# Patient Record
Sex: Female | Born: 1995 | Race: Black or African American | Hispanic: No | Marital: Single | Smoking: Never smoker
Health system: Southern US, Community
[De-identification: ages and names within clinical notes are randomized; demographics above are authoritative.]

## PROBLEM LIST (undated history)

## (undated) DIAGNOSIS — D649 Anemia, unspecified: Secondary | ICD-10-CM

## (undated) DIAGNOSIS — G43909 Migraine, unspecified, not intractable, without status migrainosus: Secondary | ICD-10-CM

## (undated) HISTORY — PX: NO PAST SURGERIES: SHX2092

---
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
## 2015-11-19 ENCOUNTER — Encounter (HOSPITAL_COMMUNITY): Payer: Self-pay | Admitting: Emergency Medicine

## 2015-11-19 ENCOUNTER — Emergency Department (HOSPITAL_COMMUNITY)
Admission: EM | Admit: 2015-11-19 | Discharge: 2015-11-20 | Disposition: A | Discharge status: Home / Self Care | Payer: 59 | Attending: Emergency Medicine | Admitting: Emergency Medicine

## 2015-11-19 ENCOUNTER — Emergency Department (HOSPITAL_COMMUNITY): Payer: 59

## 2015-11-19 DIAGNOSIS — Z862 Personal history of diseases of the blood and blood-forming organs and certain disorders involving the immune mechanism: Secondary | ICD-10-CM | POA: Diagnosis not present

## 2015-11-19 DIAGNOSIS — R0789 Other chest pain: Secondary | ICD-10-CM

## 2015-11-19 DIAGNOSIS — R0602 Shortness of breath: Secondary | ICD-10-CM | POA: Insufficient documentation

## 2015-11-19 DIAGNOSIS — Z8679 Personal history of other diseases of the circulatory system: Secondary | ICD-10-CM | POA: Insufficient documentation

## 2015-11-19 DIAGNOSIS — R079 Chest pain, unspecified: Secondary | ICD-10-CM | POA: Diagnosis present

## 2015-11-19 HISTORY — DX: Anemia, unspecified: D64.9

## 2015-11-19 HISTORY — DX: Migraine, unspecified, not intractable, without status migrainosus: G43.909

## 2015-11-19 LAB — BASIC METABOLIC PANEL
Anion gap: 12 (ref 5–15)
BUN: 15 mg/dL (ref 6–20)
CALCIUM: 9.5 mg/dL (ref 8.9–10.3)
CO2: 20 mmol/L — ABNORMAL LOW (ref 22–32)
CREATININE: 0.79 mg/dL (ref 0.44–1.00)
Chloride: 105 mmol/L (ref 101–111)
GFR calc non Af Amer: >60 mL/min (ref >60)
Glucose, Bld: 93 mg/dL (ref 65–99)
Potassium: 4 mmol/L (ref 3.5–5.1)
SODIUM: 137 mmol/L (ref 135–145)

## 2015-11-19 LAB — CBC
HCT: 35.5 % — ABNORMAL LOW (ref 36.0–46.0)
Hemoglobin: 11.3 g/dL — ABNORMAL LOW (ref 12.0–15.0)
MCH: 25.2 pg — AB (ref 26.0–34.0)
MCHC: 31.8 g/dL (ref 30.0–36.0)
MCV: 79.1 fL (ref 78.0–100.0)
PLATELETS: 406 10*3/uL — AB (ref 150–400)
RBC: 4.49 MIL/uL (ref 3.87–5.11)
RDW: 14.2 % (ref 11.5–15.5)
WBC: 8.1 10*3/uL (ref 4.0–10.5)

## 2015-11-19 LAB — I-STAT TROPONIN, ED: TROPONIN I, POC: 0 ng/mL (ref 0.00–0.08)

## 2015-11-19 NOTE — ED Notes (Signed)
Pt states that she has had chest pain since Friday that has progressively gotten worse. Sharp, L sided. Alert and oriented.

## 2015-11-20 MED ORDER — IBUPROFEN 600 MG PO TABS: 600.0000 mg | ORAL_TABLET | Freq: Four times a day (QID) | ORAL | Status: DC | PRN

## 2015-11-20 NOTE — ED Notes (Signed)
Bed: WA24 Expected date:  Expected time:  Means of arrival:  Comments: No monitor 

## 2015-11-20 NOTE — Discharge Instructions (Signed)

## 2015-11-20 NOTE — ED Provider Notes (Signed)
CSN: [id number]     Arrival date & time 11/19/15  2006 History   First MD Initiated Contact with Patient 11/20/15 [phone]     Chief Complaint  Patient presents with  . Chest Pain     (Consider location/radiation/quality/duration/timing/severity/associated sxs/prior Treatment) Patient is a 20 y.o. female presenting with chest pain. The history is provided by the patient.  Chest Pain Pain location:  L chest Pain quality: sharp and stabbing   Pain radiates to:  Does not radiate Pain radiates to the back: no   Pain severity:  Moderate Onset quality:  Gradual Duration:  5 days Timing:  Sporadic Progression:  Waxing and waning Context: breathing (deep breath)   Relieved by:  Nothing Worsened by:  Nothing tried Ineffective treatments:  None tried Associated symptoms: shortness of breath (with walking)   Associated symptoms: no abdominal pain, no cough, no fever, no lower extremity edema, no nausea and not vomiting   Risk factors: obesity   Risk factors: no diabetes mellitus, no high cholesterol, no hypertension and no smoking     Past Medical History  Diagnosis Date  . Migraines   . Anemia    No past surgical history on file. Family History  Problem Relation Age of Onset  . Heart attack Father    Social History  Substance Use Topics  . Smoking status: Never Smoker   . Smokeless tobacco: None  . Alcohol Use: No   OB History    No data available     Review of Systems  Constitutional: Negative for fever.  Respiratory: Positive for shortness of breath (with walking). Negative for cough.   Cardiovascular: Positive for chest pain.  Gastrointestinal: Negative for nausea, vomiting and abdominal pain.  All other systems reviewed and are negative.     Allergies  Review of patient's allergies indicates no known allergies.  Home Medications   Prior to Admission medications   Not on File   BP 118/85 mmHg  Pulse 94  Temp(Src) 98.3 F (36.8 C) (Oral)  Resp 13  Ht 5'  2" (1.575 m)  Wt 288 lb (130.636 kg)  BMI 52.66 kg/m2  SpO2 98%  LMP 11/19/2015 Physical Exam  Constitutional: She is oriented to person, place, and time. She appears well-developed and well-nourished. No distress.  Morbidly obese  HENT:  Head: Normocephalic.  Eyes: Conjunctivae are normal.  Neck: Neck supple. No tracheal deviation present.  Cardiovascular: Normal rate, regular rhythm and normal heart sounds.   Pulmonary/Chest: Effort normal and breath sounds normal. No respiratory distress. She exhibits tenderness (over left sternal border).  Abdominal: Soft. She exhibits no distension. There is no tenderness.  Neurological: She is alert and oriented to person, place, and time.  Skin: Skin is warm and dry.  Psychiatric: She has a normal mood and affect.  Vitals reviewed.   ED Course  Procedures (including critical care time) Labs Review Labs Reviewed  BASIC METABOLIC PANEL - Abnormal; Notable for the following:    CO2 20 (*)    All other components within normal limits  CBC - Abnormal; Notable for the following:    Hemoglobin 11.3 (*)    HCT 35.5 (*)    MCH 25.2 (*)    Platelets 406 (*)    All other components within normal limits  I-STAT TROPOININ, ED    Imaging Review Dg Chest 2 View  11/19/2015  CLINICAL DATA:  Mid chest pain and shortness of breath. EXAM: CHEST  2 VIEW COMPARISON:  None. FINDINGS: The heart  size and mediastinal contours are within normal limits. Both lungs are clear. The visualized skeletal structures are unremarkable. IMPRESSION: No active cardiopulmonary disease. Electronically Signed   By: Kennith Center M.D.   On: 11/19/2015 20:45   I have personally reviewed and evaluated these images and lab results as part of my medical decision-making.   EKG Interpretation   Date/Time:  Tuesday November 19 2015 20:12:49 EST Ventricular Rate:  104 PR Interval:  147 QRS Duration: 70 QT Interval:  333 QTC Calculation: 438 R Axis:   73 Text Interpretation:   Sinus tachycardia Low voltage, precordial leads  Baseline wander in lead(s) V1 ED PHYSICIAN INTERPRETATION AVAILABLE IN  [facility] Confirmed by TEST, Record ([id number]) on 11/20/2015 6:41:15 AM      MDM   Final diagnoses:  Chest wall pain    20 year old female presents with highly atypical chest pain, sharp over the precordium toward the left side, has been occurring intermittently over the last 5 days. Well score is 0 for PE, doubt thromboembolic event. Pain is reproducible on palpation, certain movements, and is highly likely to be musculoskeletal given the patient's morbidly obese habitus. Troponin is negative, heart score is 1, highly atypical symptoms for ACS. Plan for scheduled NSAIDs. Plan to follow up with PCP as needed and return precautions discussed for worsening or new concerning symptoms.    Lyndal Pulley, MD 11/20/15 [phone]

## 2016-11-03 ENCOUNTER — Other Ambulatory Visit: Payer: Self-pay | Admitting: Obstetrics and Gynecology

## 2016-11-03 ENCOUNTER — Other Ambulatory Visit (HOSPITAL_COMMUNITY)
Admission: RE | Admit: 2016-11-03 | Discharge: 2016-11-03 | Disposition: A | Discharge status: Home / Self Care | Payer: 59 | Source: Ambulatory Visit | Attending: Obstetrics and Gynecology | Admitting: Obstetrics and Gynecology

## 2016-11-03 DIAGNOSIS — Z01419 Encounter for gynecological examination (general) (routine) without abnormal findings: Secondary | ICD-10-CM | POA: Diagnosis present

## 2016-11-03 DIAGNOSIS — Z113 Encounter for screening for infections with a predominantly sexual mode of transmission: Secondary | ICD-10-CM | POA: Insufficient documentation

## 2016-11-04 LAB — CYTOLOGY - PAP
CHLAMYDIA, DNA PROBE: NEGATIVE
DIAGNOSIS: NEGATIVE
NEISSERIA GONORRHEA: NEGATIVE

## 2017-03-25 ENCOUNTER — Ambulatory Visit (INDEPENDENT_AMBULATORY_CARE_PROVIDER_SITE_OTHER): Payer: 59 | Admitting: Orthopedic Surgery

## 2017-03-25 ENCOUNTER — Ambulatory Visit (INDEPENDENT_AMBULATORY_CARE_PROVIDER_SITE_OTHER): Payer: 59

## 2017-03-25 DIAGNOSIS — M546 Pain in thoracic spine: Secondary | ICD-10-CM

## 2017-03-27 ENCOUNTER — Encounter (INDEPENDENT_AMBULATORY_CARE_PROVIDER_SITE_OTHER): Payer: Self-pay | Admitting: Orthopedic Surgery

## 2017-03-27 NOTE — Progress Notes (Signed)
Office Visit Note   Patient: Emily Goodman           Date of Birth: 07/22/1996           MRN: [id number] Visit Date: 03/25/2017 Requested by: Wilfrid LundBecker, Anna G, PA [address] [place] [id number] PCP: Tally JoeSwayne, David, MD  Subjective: Chief Complaint  Patient presents with  . Middle Back - Pain    HPI: Today is a patient with upper thoracic pain.  She also reports a lot of trapezial tension type pain.  Occasionally she'll have some low back pain as well but with no leg symptoms.  Her symptoms have been going on for several years.  She does take a muscle relaxer for this upper thoracic and trapezial pain.  It occurs on a daily basis.  Does not wake her from sleep.  She used to work at [employer] but she had to quit because of her back.  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  Denies any radicular symptoms in the arms.              ROS: All systems reviewed are negative as they relate to the chief complaint within the history of present illness.  Patient denies  fevers or chills.   Assessment & Plan: Visit Diagnoses:  1. 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  Patient has increased body mass index as well as a large mass of breast tissue.  Patient states that she is age E cup.  She has tried weight loss without success.  She is considering breast reduction surgery.  I would like her to try 6 week course of physical therapy first and then we will see her back at that time.  She is having some back issues which I think could be related to the uneven distribution of tissue between the front and back of her axial spine.  Follow-Up Instructions: Return in about 6 weeks (around 05/06/2017).   Orders:  Orders Placed This Encounter  Procedures  . XR Thoracic Spine 2 View   No orders of the defined types were placed in this encounter.      Procedures: No procedures performed   Clinical Data: No additional findings.  Objective: Vital Signs: There were no vitals taken for this visit.  Physical Exam:   Constitutional: Patient appears well-developed HEENT:  Head: Normocephalic Eyes:EOM are normal Neck: Normal range of motion Cardiovascular: Normal rate Pulmonary/chest: Effort normal Neurologic: Patient is alert Skin: Skin is warm Psychiatric: Patient has normal mood and affect    Ortho Exam: Orthopedic exam demonstrates full active and passive range of motion of the neck.  Shoulder range of motion is full.  Patient has large volume of soft tissue anterior to the thoracic spine.  She has no nerve retention signs in the lower chemise.  Does have some pain with forward lateral bending.  There is fair amount of muscle tension in the trapezial region.  Reflexes symmetric.  No paresthesias C5 T1.  No other masses lymph adenopathy or skin changes noted in the thoracic spine region  Specialty Comments:  No specialty comments available.  Imaging: No results found.   PMFS History: There are no active problems to display for this patient.  Past Medical History:  Diagnosis Date  . Anemia   . Migraines     Family History  Problem Relation Age of Onset  . Heart attack Father     No past surgical history on file. Social History   Occupational History  . Not on file.   Social History Main Topics  . Smoking status: Never Smoker  . Smokeless tobacco: Not on file  . Alcohol use No  . Drug use: Yes    Types: Marijuana  . Sexual activity: Not on file

## 2018-09-30 ENCOUNTER — Other Ambulatory Visit: Payer: Self-pay | Admitting: Obstetrics and Gynecology

## 2018-09-30 ENCOUNTER — Other Ambulatory Visit (HOSPITAL_COMMUNITY)
Admission: RE | Admit: 2018-09-30 | Discharge: 2018-09-30 | Disposition: A | Discharge status: Home / Self Care | Payer: BLUE CROSS/BLUE SHIELD | Source: Ambulatory Visit | Attending: Obstetrics and Gynecology | Admitting: Obstetrics and Gynecology

## 2018-09-30 DIAGNOSIS — R87612 Low grade squamous intraepithelial lesion on cytologic smear of cervix (LGSIL): Secondary | ICD-10-CM | POA: Diagnosis not present

## 2018-09-30 DIAGNOSIS — Z01419 Encounter for gynecological examination (general) (routine) without abnormal findings: Secondary | ICD-10-CM | POA: Insufficient documentation

## 2018-09-30 DIAGNOSIS — Z113 Encounter for screening for infections with a predominantly sexual mode of transmission: Secondary | ICD-10-CM | POA: Diagnosis not present

## 2018-10-06 LAB — CYTOLOGY - PAP
CHLAMYDIA, DNA PROBE: NEGATIVE
HPV: DETECTED — AB
NEISSERIA GONORRHEA: NEGATIVE

## 2018-11-09 DIAGNOSIS — Z3201 Encounter for pregnancy test, result positive: Secondary | ICD-10-CM | POA: Diagnosis not present

## 2018-11-09 DIAGNOSIS — Z3401 Encounter for supervision of normal first pregnancy, first trimester: Secondary | ICD-10-CM | POA: Diagnosis not present

## 2018-11-09 DIAGNOSIS — Z3490 Encounter for supervision of normal pregnancy, unspecified, unspecified trimester: Secondary | ICD-10-CM | POA: Diagnosis not present

## 2018-11-09 LAB — OB RESULTS CONSOLE ANTIBODY SCREEN: Antibody Screen: NEGATIVE

## 2018-11-09 LAB — OB RESULTS CONSOLE HIV ANTIBODY (ROUTINE TESTING): HIV: NONREACTIVE

## 2018-11-09 LAB — OB RESULTS CONSOLE RPR: RPR: NONREACTIVE

## 2018-11-09 LAB — OB RESULTS CONSOLE ABO/RH: RH Type: POSITIVE

## 2018-11-09 LAB — OB RESULTS CONSOLE HEPATITIS B SURFACE ANTIGEN: Hepatitis B Surface Ag: NEGATIVE

## 2018-11-09 LAB — OB RESULTS CONSOLE RUBELLA ANTIBODY, IGM: Rubella: IMMUNE

## 2018-11-23 DIAGNOSIS — Z3401 Encounter for supervision of normal first pregnancy, first trimester: Secondary | ICD-10-CM | POA: Diagnosis not present

## 2018-12-23 DIAGNOSIS — Z3490 Encounter for supervision of normal pregnancy, unspecified, unspecified trimester: Secondary | ICD-10-CM | POA: Diagnosis not present

## 2018-12-23 DIAGNOSIS — Z3402 Encounter for supervision of normal first pregnancy, second trimester: Secondary | ICD-10-CM | POA: Diagnosis not present

## 2019-01-10 DIAGNOSIS — Z36 Encounter for antenatal screening for chromosomal anomalies: Secondary | ICD-10-CM | POA: Diagnosis not present

## 2019-01-10 DIAGNOSIS — Z3402 Encounter for supervision of normal first pregnancy, second trimester: Secondary | ICD-10-CM | POA: Diagnosis not present

## 2019-01-10 DIAGNOSIS — O99212 Obesity complicating pregnancy, second trimester: Secondary | ICD-10-CM | POA: Diagnosis not present

## 2019-03-03 DIAGNOSIS — Z3402 Encounter for supervision of normal first pregnancy, second trimester: Secondary | ICD-10-CM | POA: Diagnosis not present

## 2019-03-03 DIAGNOSIS — Z3490 Encounter for supervision of normal pregnancy, unspecified, unspecified trimester: Secondary | ICD-10-CM | POA: Diagnosis not present

## 2019-03-29 DIAGNOSIS — O99213 Obesity complicating pregnancy, third trimester: Secondary | ICD-10-CM | POA: Diagnosis not present

## 2019-04-26 DIAGNOSIS — O99213 Obesity complicating pregnancy, third trimester: Secondary | ICD-10-CM | POA: Diagnosis not present

## 2019-05-23 DIAGNOSIS — O99213 Obesity complicating pregnancy, third trimester: Secondary | ICD-10-CM | POA: Diagnosis not present

## 2019-05-23 DIAGNOSIS — Z3490 Encounter for supervision of normal pregnancy, unspecified, unspecified trimester: Secondary | ICD-10-CM | POA: Diagnosis not present

## 2019-05-23 DIAGNOSIS — O3663X0 Maternal care for excessive fetal growth, third trimester, not applicable or unspecified: Secondary | ICD-10-CM | POA: Diagnosis not present

## 2019-05-23 LAB — OB RESULTS CONSOLE GC/CHLAMYDIA
Chlamydia: POSITIVE
Gonorrhea: NEGATIVE

## 2019-05-23 LAB — OB RESULTS CONSOLE GBS: GBS: NEGATIVE

## 2019-05-30 DIAGNOSIS — Z3490 Encounter for supervision of normal pregnancy, unspecified, unspecified trimester: Secondary | ICD-10-CM | POA: Diagnosis not present

## 2019-05-30 DIAGNOSIS — Z3403 Encounter for supervision of normal first pregnancy, third trimester: Secondary | ICD-10-CM | POA: Diagnosis not present

## 2019-05-31 ENCOUNTER — Telehealth (HOSPITAL_COMMUNITY): Payer: Self-pay | Admitting: *Deleted

## 2019-05-31 ENCOUNTER — Encounter (HOSPITAL_COMMUNITY): Payer: Self-pay | Admitting: *Deleted

## 2019-05-31 NOTE — Telephone Encounter (Signed)
Preadmission screen  

## 2019-06-01 ENCOUNTER — Telehealth (HOSPITAL_COMMUNITY): Payer: Self-pay | Admitting: *Deleted

## 2019-06-01 NOTE — Telephone Encounter (Signed)
Preadmission screen  

## 2019-06-02 ENCOUNTER — Telehealth (HOSPITAL_COMMUNITY): Payer: Self-pay | Admitting: *Deleted

## 2019-06-02 ENCOUNTER — Encounter (HOSPITAL_COMMUNITY): Payer: Self-pay | Admitting: *Deleted

## 2019-06-02 NOTE — Telephone Encounter (Signed)
Preadmission screen  

## 2019-06-10 ENCOUNTER — Other Ambulatory Visit: Payer: Self-pay | Admitting: Obstetrics and Gynecology

## 2019-06-10 ENCOUNTER — Other Ambulatory Visit (HOSPITAL_COMMUNITY)
Admission: RE | Admit: 2019-06-10 | Discharge: 2019-06-10 | Disposition: A | Discharge status: Home / Self Care | Payer: BC Managed Care – PPO | Source: Ambulatory Visit | Attending: Obstetrics & Gynecology | Admitting: Obstetrics & Gynecology

## 2019-06-10 ENCOUNTER — Other Ambulatory Visit: Payer: Self-pay

## 2019-06-10 DIAGNOSIS — Z20828 Contact with and (suspected) exposure to other viral communicable diseases: Secondary | ICD-10-CM | POA: Diagnosis not present

## 2019-06-10 DIAGNOSIS — Z01812 Encounter for preprocedural laboratory examination: Secondary | ICD-10-CM | POA: Diagnosis not present

## 2019-06-10 LAB — SARS CORONAVIRUS 2 (TAT 6-24 HRS): SARS Coronavirus 2: NEGATIVE

## 2019-06-10 NOTE — MAU Note (Signed)
Pt here for PAT covid swab, denies symptoms. Swab collected. 

## 2019-06-10 NOTE — H&P (Signed)
Emily Goodman is a 23 y.o. female G1P0 at  48 wks and 4 days  presenting for induction of labor . Pregnancy complicated by maternal obesity, chlamdia positive 05/23/2019 ( treated). LGA fetus .. EFW  .8 lbs 4 oz  ( 3760 grams) on 05/23/2019 .  Prenatal care provided by Dr. Christophe Louis with [facility].    OB History    Gravida  1   Para      Term      Preterm      AB      Living        SAB      TAB      Ectopic      Multiple      Live Births             Past Medical History:  Diagnosis Date  . Anemia   . Migraines    Past Surgical History:  Procedure Laterality Date  . NO PAST SURGERIES     Gyn history: Chlamydia positive 05/23/2019- treated.   Family History: family history includes Diabetes in her maternal grandfather; Heart attack in her father; Hypertension in her father; Kidney disease in her father; Stroke in her maternal grandmother. Social History:  reports that she has never smoked. She has never used smokeless tobacco. She reports current drug use. Drug: Marijuana. She reports that she does not drink alcohol.     Maternal Diabetes: No Genetic Screening: Normal Maternal Ultrasounds/Referrals: Normal Fetal Ultrasounds or other Referrals:  None Maternal Substance Abuse:  No Significant Maternal Medications:  None Significant Maternal Lab Results:  Other:  Chlamydia Positive 05/23/2019- treated.  Other Comments:  None  Review of Systems  Constitutional: Negative.   HENT: Negative.   Eyes: Negative.   Respiratory: Negative.   Cardiovascular: Negative.   Gastrointestinal: Negative.   Genitourinary: Negative.   Musculoskeletal: Negative.   Skin: Negative.   Neurological: Negative.   Endo/Heme/Allergies: Negative.   Psychiatric/Behavioral: Negative.    History   Last menstrual period 08/30/2018. Exam Physical Exam  Constitutional: She is oriented to person, place, and time. She appears well-developed and well-nourished.  HENT:   Head: Normocephalic.  Eyes: Pupils are equal, round, and reactive to light. Conjunctivae are normal.  Neck: Normal range of motion. Neck supple.  Cardiovascular: Normal rate and regular rhythm.  Respiratory: Effort normal and breath sounds normal.  GI: There is no abdominal tenderness.  Genitourinary:    Vagina normal.   Musculoskeletal: Normal range of motion.        General: Edema present.  Neurological: She is alert and oriented to person, place, and time. She has normal reflexes.  Skin: Skin is warm and dry.  Psychiatric: She has a normal mood and affect.   Cervix Closed / thick / high   Prenatal labs: ABO, Rh: O/Positive/-- (02/12 0000) Antibody: Negative (02/12 0000) Rubella: Immune (02/12 0000) RPR: Nonreactive (02/12 0000)  HBsAg: Negative (02/12 0000)  HIV: Non-reactive (02/12 0000)  GBS: Negative/-- (08/25 0000)  Chlamydia Positive 05/23/2019 Treated   Assessment/Plan: 39 wks and 4 days term pregnancy or induction Cytotec for cervical ripening. \ H/O marijuana use- toxicology screen  Anticipate SVD   Christophe Louis 06/10/2019, 5:09 PM

## 2019-06-10 NOTE — H&P (Deleted)
  The note originally documented on this encounter has been moved the the encounter in which it belongs.  

## 2019-06-12 ENCOUNTER — Other Ambulatory Visit: Payer: Self-pay

## 2019-06-12 ENCOUNTER — Inpatient Hospital Stay (HOSPITAL_COMMUNITY)
Admission: AD | Admit: 2019-06-12 | Discharge: 2019-06-16 | DRG: 788 | Disposition: A | Discharge status: Home / Self Care | Payer: BC Managed Care – PPO | Attending: Obstetrics and Gynecology | Admitting: Obstetrics and Gynecology

## 2019-06-12 ENCOUNTER — Inpatient Hospital Stay (HOSPITAL_COMMUNITY): Payer: BC Managed Care – PPO | Admitting: Anesthesiology

## 2019-06-12 ENCOUNTER — Encounter (HOSPITAL_COMMUNITY): Payer: Self-pay

## 2019-06-12 ENCOUNTER — Inpatient Hospital Stay (HOSPITAL_COMMUNITY): Payer: BC Managed Care – PPO

## 2019-06-12 DIAGNOSIS — Z98891 History of uterine scar from previous surgery: Secondary | ICD-10-CM

## 2019-06-12 DIAGNOSIS — Z3A39 39 weeks gestation of pregnancy: Secondary | ICD-10-CM | POA: Diagnosis not present

## 2019-06-12 DIAGNOSIS — Z3490 Encounter for supervision of normal pregnancy, unspecified, unspecified trimester: Secondary | ICD-10-CM

## 2019-06-12 DIAGNOSIS — O1214 Gestational proteinuria, complicating childbirth: Secondary | ICD-10-CM | POA: Diagnosis not present

## 2019-06-12 DIAGNOSIS — O339 Maternal care for disproportion, unspecified: Secondary | ICD-10-CM | POA: Diagnosis not present

## 2019-06-12 DIAGNOSIS — O3663X0 Maternal care for excessive fetal growth, third trimester, not applicable or unspecified: Secondary | ICD-10-CM | POA: Diagnosis present

## 2019-06-12 DIAGNOSIS — O3660X0 Maternal care for excessive fetal growth, unspecified trimester, not applicable or unspecified: Secondary | ICD-10-CM | POA: Diagnosis not present

## 2019-06-12 DIAGNOSIS — O99214 Obesity complicating childbirth: Principal | ICD-10-CM | POA: Diagnosis present

## 2019-06-12 DIAGNOSIS — Z3A00 Weeks of gestation of pregnancy not specified: Secondary | ICD-10-CM | POA: Diagnosis not present

## 2019-06-12 LAB — CBC
HCT: 31.8 % — ABNORMAL LOW (ref 36.0–46.0)
Hemoglobin: 10.2 g/dL — ABNORMAL LOW (ref 12.0–15.0)
MCH: 26.9 pg (ref 26.0–34.0)
MCHC: 32.1 g/dL (ref 30.0–36.0)
MCV: 83.9 fL (ref 80.0–100.0)
Platelets: 284 10*3/uL (ref 150–400)
RBC: 3.79 MIL/uL — ABNORMAL LOW (ref 3.87–5.11)
RDW: 16.1 % — ABNORMAL HIGH (ref 11.5–15.5)
WBC: 8.4 10*3/uL (ref 4.0–10.5)
nRBC: 0.2 % (ref 0.0–0.2)

## 2019-06-12 LAB — COMPREHENSIVE METABOLIC PANEL
ALT: 14 U/L (ref 0–44)
AST: 25 U/L (ref 15–41)
Albumin: 2.3 g/dL — ABNORMAL LOW (ref 3.5–5.0)
Alkaline Phosphatase: 102 U/L (ref 38–126)
Anion gap: 11 (ref 5–15)
BUN: 10 mg/dL (ref 6–20)
CO2: 20 mmol/L — ABNORMAL LOW (ref 22–32)
Calcium: 9.1 mg/dL (ref 8.9–10.3)
Chloride: 105 mmol/L (ref 98–111)
Creatinine, Ser: 0.77 mg/dL (ref 0.44–1.00)
GFR calc Af Amer: >60 mL/min (ref >60)
GFR calc non Af Amer: >60 mL/min (ref >60)
Glucose, Bld: 80 mg/dL (ref 70–99)
Potassium: 3.9 mmol/L (ref 3.5–5.1)
Sodium: 136 mmol/L (ref 135–145)
Total Bilirubin: 0.6 mg/dL (ref 0.3–1.2)
Total Protein: 6 g/dL — ABNORMAL LOW (ref 6.5–8.1)

## 2019-06-12 LAB — RAPID URINE DRUG SCREEN, HOSP PERFORMED
Amphetamines: NOT DETECTED
Barbiturates: NOT DETECTED
Benzodiazepines: NOT DETECTED
Cocaine: NOT DETECTED
Opiates: NOT DETECTED
Tetrahydrocannabinol: NOT DETECTED

## 2019-06-12 LAB — ABO/RH: ABO/RH(D): O POS

## 2019-06-12 LAB — PROTEIN / CREATININE RATIO, URINE
Creatinine, Urine: 92.64 mg/dL
Protein Creatinine Ratio: 0.76 mg/mg{Cre} — ABNORMAL HIGH (ref 0.00–0.15)
Total Protein, Urine: 70 mg/dL

## 2019-06-12 LAB — TYPE AND SCREEN
ABO/RH(D): O POS
Antibody Screen: NEGATIVE

## 2019-06-12 LAB — RPR: RPR Ser Ql: NONREACTIVE

## 2019-06-12 MED ORDER — SODIUM CHLORIDE (PF) 0.9 % IJ SOLN
INTRAMUSCULAR | Status: DC | PRN
  Administered 2019-06-12: 12 mL/h via EPIDURAL

## 2019-06-12 MED ORDER — LIDOCAINE HCL (PF) 1 % IJ SOLN
INTRAMUSCULAR | Status: DC | PRN
  Administered 2019-06-12 (×2): 4 mL via EPIDURAL

## 2019-06-12 MED ORDER — OXYCODONE-ACETAMINOPHEN 5-325 MG PO TABS: 2.0000 | ORAL_TABLET | ORAL | Status: DC | PRN

## 2019-06-12 MED ORDER — LIDOCAINE HCL (PF) 1 % IJ SOLN: 30.0000 mL | INTRAMUSCULAR | Status: DC | PRN

## 2019-06-12 MED ORDER — DIPHENHYDRAMINE HCL 50 MG/ML IJ SOLN: 12.5000 mg | INTRAMUSCULAR | Status: DC | PRN

## 2019-06-12 MED ORDER — MISOPROSTOL 25 MCG QUARTER TABLET
25.0000 ug | ORAL_TABLET | ORAL | Status: DC | PRN
  Administered 2019-06-12 (×3): 25 ug via VAGINAL
  Filled 2019-06-12 (×3): qty 1

## 2019-06-12 MED ORDER — LACTATED RINGERS IV SOLN: 500.0000 mL | INTRAVENOUS | Status: DC | PRN

## 2019-06-12 MED ORDER — PHENYLEPHRINE 40 MCG/ML (10ML) SYRINGE FOR IV PUSH (FOR BLOOD PRESSURE SUPPORT)
80.0000 ug | PREFILLED_SYRINGE | INTRAVENOUS | Status: AC | PRN
  Administered 2019-06-12 (×3): 80 ug via INTRAVENOUS

## 2019-06-12 MED ORDER — LACTATED RINGERS IV SOLN
INTRAVENOUS | Status: DC
  Administered 2019-06-12 – 2019-06-13 (×6): via INTRAVENOUS

## 2019-06-12 MED ORDER — OXYTOCIN 40 UNITS IN NORMAL SALINE INFUSION - SIMPLE MED
2.5000 [IU]/h | INTRAVENOUS | Status: DC
  Filled 2019-06-12: qty 1000

## 2019-06-12 MED ORDER — FENTANYL CITRATE (PF) 100 MCG/2ML IJ SOLN
50.0000 ug | INTRAMUSCULAR | Status: DC | PRN
  Administered 2019-06-12: 14:00:00 100 ug via INTRAVENOUS
  Filled 2019-06-12: qty 2

## 2019-06-12 MED ORDER — EPHEDRINE 5 MG/ML INJ: 10.0000 mg | INTRAVENOUS | Status: DC | PRN

## 2019-06-12 MED ORDER — OXYCODONE-ACETAMINOPHEN 5-325 MG PO TABS: 1.0000 | ORAL_TABLET | ORAL | Status: DC | PRN

## 2019-06-12 MED ORDER — SOD CITRATE-CITRIC ACID 500-334 MG/5ML PO SOLN
30.0000 mL | ORAL | Status: DC | PRN
  Filled 2019-06-12: qty 30

## 2019-06-12 MED ORDER — PHENYLEPHRINE 40 MCG/ML (10ML) SYRINGE FOR IV PUSH (FOR BLOOD PRESSURE SUPPORT)
80.0000 ug | PREFILLED_SYRINGE | INTRAVENOUS | Status: DC | PRN
  Administered 2019-06-12 (×2): 80 ug via INTRAVENOUS
  Filled 2019-06-12: qty 10

## 2019-06-12 MED ORDER — LACTATED RINGERS IV SOLN
500.0000 mL | Freq: Once | INTRAVENOUS | Status: AC
  Administered 2019-06-12: 1000 mL via INTRAVENOUS

## 2019-06-12 MED ORDER — TERBUTALINE SULFATE 1 MG/ML IJ SOLN: 0.2500 mg | Freq: Once | INTRAMUSCULAR | Status: DC | PRN

## 2019-06-12 MED ORDER — OXYTOCIN BOLUS FROM INFUSION: 500.0000 mL | Freq: Once | INTRAVENOUS | Status: DC

## 2019-06-12 MED ORDER — HYDROXYZINE HCL 50 MG PO TABS: 50.0000 mg | ORAL_TABLET | Freq: Four times a day (QID) | ORAL | Status: DC | PRN

## 2019-06-12 MED ORDER — ONDANSETRON HCL 4 MG/2ML IJ SOLN: 4.0000 mg | Freq: Four times a day (QID) | INTRAMUSCULAR | Status: DC | PRN

## 2019-06-12 MED ORDER — OXYTOCIN 40 UNITS IN NORMAL SALINE INFUSION - SIMPLE MED
1.0000 m[IU]/min | INTRAVENOUS | Status: DC
  Administered 2019-06-12: 14:00:00 2 m[IU]/min via INTRAVENOUS
  Administered 2019-06-13: 16 m[IU]/min via INTRAVENOUS
  Filled 2019-06-12: qty 1000

## 2019-06-12 MED ORDER — ACETAMINOPHEN 325 MG PO TABS: 650.0000 mg | ORAL_TABLET | ORAL | Status: DC | PRN

## 2019-06-12 MED ORDER — FENTANYL-BUPIVACAINE-NACL 0.5-0.125-0.9 MG/250ML-% EP SOLN
12.0000 mL/h | EPIDURAL | Status: DC | PRN
  Administered 2019-06-13: 12 mL/h via EPIDURAL
  Filled 2019-06-12 (×2): qty 250

## 2019-06-12 NOTE — Anesthesia Preprocedure Evaluation (Signed)
Anesthesia Evaluation  Patient identified by MRN, date of birth, ID band Patient awake    Reviewed: Allergy & Precautions, NPO status , Patient's Chart, lab work & pertinent test results  Airway Mallampati: II  TM Distance: >3 FB Neck ROM: Full    Dental no notable dental hx.    Pulmonary neg pulmonary ROS,    Pulmonary exam normal breath sounds clear to auscultation       Cardiovascular negative cardio ROS Normal cardiovascular exam Rhythm:Regular Rate:Normal     Neuro/Psych  Headaches, negative psych ROS   GI/Hepatic negative GI ROS, Neg liver ROS,   Endo/Other  negative endocrine ROS  Renal/GU negative Renal ROS     Musculoskeletal negative musculoskeletal ROS (+)   Abdominal   Peds  Hematology  (+) Blood dyscrasia, anemia ,   Anesthesia Other Findings   Reproductive/Obstetrics (+) Pregnancy                             Anesthesia Physical Anesthesia Plan  ASA: IV  Anesthesia Plan: Epidural   Post-op Pain Management:    Induction:   PONV Risk Score and Plan:   Airway Management Planned:   Additional Equipment:   Intra-op Plan:   Post-operative Plan:   Informed Consent: I have reviewed the patients History and Physical, chart, labs and discussed the procedure including the risks, benefits and alternatives for the proposed anesthesia with the patient or authorized representative who has indicated his/her understanding and acceptance.       Plan Discussed with:   Anesthesia Plan Comments:         Anesthesia Quick Evaluation

## 2019-06-12 NOTE — Progress Notes (Signed)
   Subjective: Pt is comfortable with her epidural .   Objective: BP (!) 83/61   Pulse (!) 118   Temp 97.6 F (36.4 C) (Axillary)   Resp (!) 24   Ht 5\' 3"  (1.6 m)   Wt (!) 166.7 kg   LMP 08/30/2018   SpO2 100%   BMI 65.08 kg/m  I/O last 3 completed shifts: In: -  Out: 150 [Urine:150] Total I/O In: -  Out: 1500 [Urine:1500]  FHT:  FHR: 150 bpm, variability: moderate,  accelerations:  Present,  decelerations:  Absent currently but variable prior to position change  UC:   regular, every 2 minutes SVE:   Dilation: 5.5 Effacement (%): 100 Station: -3 Exam by:: MD Landry Mellow  Labs: Lab Results  Component Value Date   WBC 8.4 06/12/2019   HGB 10.2 (L) 06/12/2019   HCT 31.8 (L) 06/12/2019   MCV 83.9 06/12/2019   PLT 284 06/12/2019    Assessment / Plan: Induction of labor due to term with large for gestational age fetus ,  progressing well on pitocin  Labor: Continue pitocin. .. AROM with fetal descent  Preeclampsia:  labs stable Fetal Wellbeing:  Category I Pain Control:  Epidural I/D:  n/a Anticipated MOD:  NSVD  Emily Goodman 06/12/2019, 11:42 PM

## 2019-06-12 NOTE — Progress Notes (Signed)
   Subjective: Feeling contractions no lof no vaginal bleeding. No headache visual disturbances or ruq pain.   Objective: BP (!) 110/58 (BP Location: Right Arm) Comment (BP Location): large burgundy cuff on upper arm  Pulse (!) 114   Temp 98 F (36.7 C) (Oral)   Resp (!) 22   Ht 5\' 3"  (1.6 m)   Wt (!) 166.7 kg   LMP 08/30/2018   BMI 65.08 kg/m  No intake/output data recorded. No intake/output data recorded.  FHT:  FHR: 140 bpm, variability: moderate,  accelerations:  Present,  decelerations:  Absent UC:   Irregular SVE:  2.5/75/-3 Labs: Lab Results  Component Value Date   WBC 8.4 06/12/2019   HGB 10.2 (L) 06/12/2019   HCT 31.8 (L) 06/12/2019   MCV 83.9 06/12/2019   PLT 284 06/12/2019    Assessment / Plan: induction of labor at 39 wks and 4 days due to term with lga fetus   Labor: start pitocin 2x2 Preeclampsia:  Labs stable  Fetal Wellbeing:  Category I Pain Control:  Labor support without medications I/D:  n/a Anticipated MOD:  NSVD  Christophe Louis 06/12/2019, 1:56 PM

## 2019-06-12 NOTE — Anesthesia Procedure Notes (Signed)
Epidural Patient location during procedure: OB Start time: 06/12/2019 3:54 PM End time: 06/12/2019 4:05 PM  Staffing Anesthesiologist: Nolon Nations, MD Performed: anesthesiologist   Preanesthetic Checklist Completed: patient identified, pre-op evaluation, timeout performed, IV checked, risks and benefits discussed and monitors and equipment checked  Epidural Patient position: sitting Prep: site prepped and draped and DuraPrep Patient monitoring: heart rate, continuous pulse ox and blood pressure Approach: midline Location: L2-L3 Injection technique: LOR air and LOR saline  Needle:  Needle type: Tuohy  Needle gauge: 17 G Needle length: 9 cm Needle insertion depth: 9 cm Catheter type: closed end flexible Catheter size: 19 Gauge Catheter at skin depth: 15 cm Test dose: negative  Assessment Sensory level: T8 Events: blood not aspirated, injection not painful, no injection resistance, negative IV test and no paresthesia  Additional Notes Reason for block:procedure for pain

## 2019-06-12 NOTE — Progress Notes (Signed)
   Subjective: Patient is comfortable with her epidural   Objective: BP (!) 107/51 (BP Location: Left Arm)   Pulse (!) 110   Temp 98.1 F (36.7 C) (Oral)   Resp 20   Ht 5\' 3"  (1.6 m)   Wt (!) 166.7 kg   LMP 08/30/2018   SpO2 100%   BMI 65.08 kg/m  I/O last 3 completed shifts: In: -  Out: 150 [Urine:150] No intake/output data recorded.  FHT:  FHR: 140 bpm, variability: moderate,  accelerations:  Present,  decelerations:  Absent UC:   Irregular pt on 6 mu of pitocin  SVE:   Dilation: 4 Effacement (%): 80 Station: -3 Exam by:: MD Landry Mellow  Labs: Lab Results  Component Value Date   WBC 8.4 06/12/2019   HGB 10.2 (L) 06/12/2019   HCT 31.8 (L) 06/12/2019   MCV 83.9 06/12/2019   PLT 284 06/12/2019    Assessment / Plan: Induction of labor due to term with LGA fetus ,  progressing well on pitocin  Labor: Progressing on Pitocin, will continue to increase then AROM Preeclampsia:  labs stable Fetal Wellbeing:  Category I Pain Control:  Epidural I/D:  n/a Anticipated MOD:  NSVD  SCDs for DVT prophylaxis given morbid obesity   Christophe Louis 06/12/2019, 7:49 PM

## 2019-06-13 ENCOUNTER — Encounter (HOSPITAL_COMMUNITY)
Admission: AD | Disposition: A | Discharge status: Home / Self Care | Payer: Self-pay | Source: Home / Self Care | Attending: Obstetrics and Gynecology

## 2019-06-13 ENCOUNTER — Encounter (HOSPITAL_COMMUNITY): Payer: Self-pay | Admitting: Neonatal-Perinatal Medicine

## 2019-06-13 SURGERY — Surgical Case
Anesthesia: Epidural

## 2019-06-13 MED ORDER — ACETAMINOPHEN 325 MG PO TABS: 325.0000 mg | ORAL_TABLET | ORAL | Status: DC | PRN

## 2019-06-13 MED ORDER — SOD CITRATE-CITRIC ACID 500-334 MG/5ML PO SOLN
30.0000 mL | ORAL | Status: AC
  Administered 2019-06-13: 17:00:00 30 mL via ORAL

## 2019-06-13 MED ORDER — OXYCODONE HCL 5 MG PO TABS
5.0000 mg | ORAL_TABLET | ORAL | Status: DC | PRN
  Administered 2019-06-14 – 2019-06-15 (×5): 5 mg via ORAL
  Administered 2019-06-16: 03:00:00 10 mg via ORAL
  Filled 2019-06-13 (×2): qty 1
  Filled 2019-06-13: qty 2
  Filled 2019-06-13 (×3): qty 1

## 2019-06-13 MED ORDER — METOCLOPRAMIDE HCL 5 MG/ML IJ SOLN
INTRAMUSCULAR | Status: DC | PRN
  Administered 2019-06-13 (×2): 5 mg via INTRAVENOUS

## 2019-06-13 MED ORDER — METHYLERGONOVINE MALEATE 0.2 MG PO TABS: 0.2000 mg | ORAL_TABLET | ORAL | Status: DC | PRN

## 2019-06-13 MED ORDER — SODIUM CHLORIDE 0.9 % IV SOLN
INTRAVENOUS | Status: DC | PRN
  Administered 2019-06-13: 40 [IU] via INTRAVENOUS

## 2019-06-13 MED ORDER — PHENYLEPHRINE 40 MCG/ML (10ML) SYRINGE FOR IV PUSH (FOR BLOOD PRESSURE SUPPORT)
PREFILLED_SYRINGE | INTRAVENOUS | Status: DC | PRN
  Administered 2019-06-13 (×3): 80 ug via INTRAVENOUS
  Administered 2019-06-13: 40 ug via INTRAVENOUS
  Administered 2019-06-13 (×2): 80 ug via INTRAVENOUS

## 2019-06-13 MED ORDER — NALOXONE HCL 4 MG/10ML IJ SOLN
1.0000 ug/kg/h | INTRAVENOUS | Status: DC | PRN
  Filled 2019-06-13: qty 5

## 2019-06-13 MED ORDER — SODIUM BICARBONATE 8.4 % IV SOLN
INTRAVENOUS | Status: DC | PRN
  Administered 2019-06-13 (×4): 5 mL via EPIDURAL

## 2019-06-13 MED ORDER — ONDANSETRON HCL 4 MG/2ML IJ SOLN: 4.0000 mg | Freq: Three times a day (TID) | INTRAMUSCULAR | Status: DC | PRN

## 2019-06-13 MED ORDER — OXYCODONE HCL 5 MG/5ML PO SOLN: 5.0000 mg | Freq: Once | ORAL | Status: DC | PRN

## 2019-06-13 MED ORDER — SCOPOLAMINE 1 MG/3DAYS TD PT72
1.0000 | MEDICATED_PATCH | Freq: Once | TRANSDERMAL | Status: DC
  Administered 2019-06-13: 1.5 mg via TRANSDERMAL
  Filled 2019-06-13: qty 1

## 2019-06-13 MED ORDER — SODIUM CHLORIDE 0.9% FLUSH: 3.0000 mL | INTRAVENOUS | Status: DC | PRN

## 2019-06-13 MED ORDER — HYDROMORPHONE HCL 1 MG/ML IJ SOLN: 0.2000 mg | INTRAMUSCULAR | Status: DC | PRN

## 2019-06-13 MED ORDER — NALBUPHINE HCL 10 MG/ML IJ SOLN
5.0000 mg | INTRAMUSCULAR | Status: DC | PRN
  Filled 2019-06-13: qty 0.5

## 2019-06-13 MED ORDER — MENTHOL 3 MG MT LOZG: 1.0000 | LOZENGE | OROMUCOSAL | Status: DC | PRN

## 2019-06-13 MED ORDER — DIPHENHYDRAMINE HCL 25 MG PO CAPS
25.0000 mg | ORAL_CAPSULE | Freq: Four times a day (QID) | ORAL | Status: DC | PRN
  Administered 2019-06-13: 22:00:00 25 mg via ORAL
  Filled 2019-06-13: qty 1

## 2019-06-13 MED ORDER — IBUPROFEN 800 MG PO TABS
800.0000 mg | ORAL_TABLET | Freq: Three times a day (TID) | ORAL | Status: DC
  Administered 2019-06-13 – 2019-06-16 (×8): 800 mg via ORAL
  Filled 2019-06-13 (×8): qty 1

## 2019-06-13 MED ORDER — KETOROLAC TROMETHAMINE 30 MG/ML IJ SOLN
30.0000 mg | Freq: Four times a day (QID) | INTRAMUSCULAR | Status: AC | PRN
  Administered 2019-06-13: 20:00:00 30 mg via INTRAVENOUS

## 2019-06-13 MED ORDER — NALBUPHINE HCL 10 MG/ML IJ SOLN
5.0000 mg | Freq: Once | INTRAMUSCULAR | Status: DC | PRN
  Filled 2019-06-13: qty 0.5

## 2019-06-13 MED ORDER — ENOXAPARIN SODIUM 80 MG/0.8ML ~~LOC~~ SOLN
80.0000 mg | SUBCUTANEOUS | Status: DC
  Administered 2019-06-14 – 2019-06-15 (×2): 80 mg via SUBCUTANEOUS
  Filled 2019-06-13 (×2): qty 0.8

## 2019-06-13 MED ORDER — DEXAMETHASONE SODIUM PHOSPHATE 4 MG/ML IJ SOLN
INTRAMUSCULAR | Status: AC
  Filled 2019-06-13: qty 1

## 2019-06-13 MED ORDER — MEPERIDINE HCL 25 MG/ML IJ SOLN: 6.2500 mg | INTRAMUSCULAR | Status: DC | PRN

## 2019-06-13 MED ORDER — METHYLERGONOVINE MALEATE 0.2 MG/ML IJ SOLN
INTRAMUSCULAR | Status: DC | PRN
  Administered 2019-06-13: 0.2 mg via INTRAMUSCULAR

## 2019-06-13 MED ORDER — ACETAMINOPHEN 325 MG PO TABS
650.0000 mg | ORAL_TABLET | ORAL | Status: DC | PRN
  Administered 2019-06-14 – 2019-06-16 (×8): 650 mg via ORAL
  Filled 2019-06-13 (×8): qty 2

## 2019-06-13 MED ORDER — PRENATAL MULTIVITAMIN CH
1.0000 | ORAL_TABLET | Freq: Every day | ORAL | Status: DC
  Administered 2019-06-14 – 2019-06-16 (×3): 1 via ORAL
  Filled 2019-06-13 (×3): qty 1

## 2019-06-13 MED ORDER — MORPHINE SULFATE (PF) 0.5 MG/ML IJ SOLN
INTRAMUSCULAR | Status: DC | PRN
  Administered 2019-06-13: 3 mg via EPIDURAL

## 2019-06-13 MED ORDER — FENTANYL CITRATE (PF) 100 MCG/2ML IJ SOLN: 25.0000 ug | INTRAMUSCULAR | Status: DC | PRN

## 2019-06-13 MED ORDER — ONDANSETRON HCL 4 MG/2ML IJ SOLN
INTRAMUSCULAR | Status: AC
  Filled 2019-06-13: qty 2

## 2019-06-13 MED ORDER — DIBUCAINE (PERIANAL) 1 % EX OINT: 1.0000 "application " | TOPICAL_OINTMENT | CUTANEOUS | Status: DC | PRN

## 2019-06-13 MED ORDER — SODIUM BICARBONATE 8.4 % IV SOLN
INTRAVENOUS | Status: AC
  Filled 2019-06-13: qty 50

## 2019-06-13 MED ORDER — SIMETHICONE 80 MG PO CHEW: 80.0000 mg | CHEWABLE_TABLET | ORAL | Status: DC | PRN

## 2019-06-13 MED ORDER — DEXTROSE 5 % IV SOLN
3.0000 g | INTRAVENOUS | Status: AC
  Administered 2019-06-13: 3 g via INTRAVENOUS
  Filled 2019-06-13: qty 3000

## 2019-06-13 MED ORDER — PHENYLEPHRINE 40 MCG/ML (10ML) SYRINGE FOR IV PUSH (FOR BLOOD PRESSURE SUPPORT)
PREFILLED_SYRINGE | INTRAVENOUS | Status: AC
  Filled 2019-06-13: qty 20

## 2019-06-13 MED ORDER — SODIUM CHLORIDE 0.9 % IV SOLN
INTRAVENOUS | Status: DC | PRN
  Administered 2019-06-13: 18:00:00 via INTRAVENOUS

## 2019-06-13 MED ORDER — LACTATED RINGERS IV SOLN
INTRAVENOUS | Status: DC
  Administered 2019-06-14: 05:00:00 via INTRAVENOUS

## 2019-06-13 MED ORDER — KETOROLAC TROMETHAMINE 30 MG/ML IJ SOLN: 30.0000 mg | Freq: Four times a day (QID) | INTRAMUSCULAR | Status: AC | PRN

## 2019-06-13 MED ORDER — LIDOCAINE-EPINEPHRINE (PF) 2 %-1:200000 IJ SOLN
INTRAMUSCULAR | Status: AC
  Filled 2019-06-13: qty 10

## 2019-06-13 MED ORDER — WITCH HAZEL-GLYCERIN EX PADS: 1.0000 "application " | MEDICATED_PAD | CUTANEOUS | Status: DC | PRN

## 2019-06-13 MED ORDER — OXYTOCIN 40 UNITS IN NORMAL SALINE INFUSION - SIMPLE MED: 2.5000 [IU]/h | INTRAVENOUS | Status: AC

## 2019-06-13 MED ORDER — SENNOSIDES-DOCUSATE SODIUM 8.6-50 MG PO TABS
2.0000 | ORAL_TABLET | ORAL | Status: DC
  Administered 2019-06-13 – 2019-06-15 (×3): 2 via ORAL
  Filled 2019-06-13 (×3): qty 2

## 2019-06-13 MED ORDER — SIMETHICONE 80 MG PO CHEW
80.0000 mg | CHEWABLE_TABLET | ORAL | Status: DC
  Administered 2019-06-13 – 2019-06-15 (×3): 80 mg via ORAL
  Filled 2019-06-13 (×3): qty 1

## 2019-06-13 MED ORDER — METOCLOPRAMIDE HCL 5 MG/ML IJ SOLN
INTRAMUSCULAR | Status: AC
  Filled 2019-06-13: qty 2

## 2019-06-13 MED ORDER — ZOLPIDEM TARTRATE 5 MG PO TABS: 5.0000 mg | ORAL_TABLET | Freq: Every evening | ORAL | Status: DC | PRN

## 2019-06-13 MED ORDER — LACTATED RINGERS IV SOLN
INTRAVENOUS | Status: DC | PRN
  Administered 2019-06-13: 17:00:00 via INTRAVENOUS

## 2019-06-13 MED ORDER — ACETAMINOPHEN 160 MG/5ML PO SOLN: 325.0000 mg | ORAL | Status: DC | PRN

## 2019-06-13 MED ORDER — DIPHENHYDRAMINE HCL 25 MG PO CAPS: 25.0000 mg | ORAL_CAPSULE | ORAL | Status: DC | PRN

## 2019-06-13 MED ORDER — METHYLERGONOVINE MALEATE 0.2 MG/ML IJ SOLN
INTRAMUSCULAR | Status: AC
  Filled 2019-06-13: qty 1

## 2019-06-13 MED ORDER — FERROUS SULFATE 325 (65 FE) MG PO TABS
325.0000 mg | ORAL_TABLET | Freq: Two times a day (BID) | ORAL | Status: DC
  Administered 2019-06-14 – 2019-06-16 (×5): 325 mg via ORAL
  Filled 2019-06-13 (×5): qty 1

## 2019-06-13 MED ORDER — ONDANSETRON HCL 4 MG/2ML IJ SOLN: 4.0000 mg | Freq: Once | INTRAMUSCULAR | Status: DC | PRN

## 2019-06-13 MED ORDER — DIPHENHYDRAMINE HCL 50 MG/ML IJ SOLN: 12.5000 mg | INTRAMUSCULAR | Status: DC | PRN

## 2019-06-13 MED ORDER — COCONUT OIL OIL: 1.0000 "application " | TOPICAL_OIL | Status: DC | PRN

## 2019-06-13 MED ORDER — KETOROLAC TROMETHAMINE 30 MG/ML IJ SOLN
INTRAMUSCULAR | Status: AC
  Filled 2019-06-13: qty 1

## 2019-06-13 MED ORDER — MORPHINE SULFATE (PF) 0.5 MG/ML IJ SOLN
INTRAMUSCULAR | Status: AC
  Filled 2019-06-13: qty 10

## 2019-06-13 MED ORDER — DEXAMETHASONE SODIUM PHOSPHATE 4 MG/ML IJ SOLN
INTRAMUSCULAR | Status: DC | PRN
  Administered 2019-06-13: 4 mg via INTRAVENOUS

## 2019-06-13 MED ORDER — ONDANSETRON HCL 4 MG/2ML IJ SOLN
INTRAMUSCULAR | Status: DC | PRN
  Administered 2019-06-13: 4 mg via INTRAVENOUS

## 2019-06-13 MED ORDER — NALOXONE HCL 0.4 MG/ML IJ SOLN: 0.4000 mg | INTRAMUSCULAR | Status: DC | PRN

## 2019-06-13 MED ORDER — OXYCODONE HCL 5 MG PO TABS: 5.0000 mg | ORAL_TABLET | Freq: Once | ORAL | Status: DC | PRN

## 2019-06-13 MED ORDER — METHYLERGONOVINE MALEATE 0.2 MG/ML IJ SOLN: 0.2000 mg | INTRAMUSCULAR | Status: DC | PRN

## 2019-06-13 MED ORDER — SIMETHICONE 80 MG PO CHEW
80.0000 mg | CHEWABLE_TABLET | Freq: Three times a day (TID) | ORAL | Status: DC
  Administered 2019-06-14 – 2019-06-16 (×7): 80 mg via ORAL
  Filled 2019-06-13 (×8): qty 1

## 2019-06-13 SURGICAL SUPPLY — 42 items
APL SKNCLS STERI-STRIP NONHPOA (GAUZE/BANDAGES/DRESSINGS) ×1
BARRIER ADHS 3X4 INTERCEED (GAUZE/BANDAGES/DRESSINGS) ×2 IMPLANT
BENZOIN TINCTURE PRP APPL 2/3 (GAUZE/BANDAGES/DRESSINGS) ×3 IMPLANT
BRR ADH 4X3 ABS CNTRL BYND (GAUZE/BANDAGES/DRESSINGS) ×1
CHLORAPREP W/TINT 26ML (MISCELLANEOUS) ×3 IMPLANT
CLAMP CORD UMBIL (MISCELLANEOUS) IMPLANT
CLOSURE STERI STRIP 1/2 X4 (GAUZE/BANDAGES/DRESSINGS) ×1 IMPLANT
CLOSURE WOUND 1/2 X4 (GAUZE/BANDAGES/DRESSINGS) ×1
CLOTH BEACON ORANGE TIMEOUT ST (SAFETY) ×3 IMPLANT
DRSG OPSITE POSTOP 4X10 (GAUZE/BANDAGES/DRESSINGS) ×3 IMPLANT
ELECT REM PT RETURN 9FT ADLT (ELECTROSURGICAL) ×3
ELECTRODE REM PT RTRN 9FT ADLT (ELECTROSURGICAL) ×1 IMPLANT
EXTRACTOR VACUUM KIWI (MISCELLANEOUS) IMPLANT
GLOVE BIOGEL M 7.0 STRL (GLOVE) ×6 IMPLANT
GLOVE BIOGEL PI IND STRL 7.0 (GLOVE) ×3 IMPLANT
GLOVE BIOGEL PI INDICATOR 7.0 (GLOVE) ×6
GOWN STRL REUS W/TWL LRG LVL3 (GOWN DISPOSABLE) ×9 IMPLANT
HEMOSTAT ARISTA ABSORB 3G PWDR (HEMOSTASIS) ×2 IMPLANT
KIT ABG SYR 3ML LUER SLIP (SYRINGE) IMPLANT
KIT PREVENA INCISION MGT20CM45 (CANNISTER) ×2 IMPLANT
NDL HYPO 25X5/8 SAFETYGLIDE (NEEDLE) IMPLANT
NEEDLE HYPO 25X5/8 SAFETYGLIDE (NEEDLE) IMPLANT
NS IRRIG 1000ML POUR BTL (IV SOLUTION) ×3 IMPLANT
PACK C SECTION WH (CUSTOM PROCEDURE TRAY) ×3 IMPLANT
PAD OB MATERNITY 4.3X12.25 (PERSONAL CARE ITEMS) ×3 IMPLANT
PENCIL SMOKE EVAC W/HOLSTER (ELECTROSURGICAL) ×5 IMPLANT
RETAINER VISCERAL (MISCELLANEOUS) ×2 IMPLANT
RTRCTR C-SECT PINK 25CM LRG (MISCELLANEOUS) IMPLANT
STRIP CLOSURE SKIN 1/2X4 (GAUZE/BANDAGES/DRESSINGS) ×2 IMPLANT
SUT PDS AB 0 CT1 27 (SUTURE) ×6 IMPLANT
SUT PLAIN 0 NONE (SUTURE) IMPLANT
SUT PLAIN 2 0 XLH (SUTURE) ×2 IMPLANT
SUT VIC AB 0 CTX 36 (SUTURE) ×9
SUT VIC AB 0 CTX36XBRD ANBCTRL (SUTURE) ×3 IMPLANT
SUT VIC AB 2-0 CT1 27 (SUTURE) ×3
SUT VIC AB 2-0 CT1 TAPERPNT 27 (SUTURE) ×1 IMPLANT
SUT VIC AB 3-0 SH 27 (SUTURE)
SUT VIC AB 3-0 SH 27X BRD (SUTURE) IMPLANT
SUT VIC AB 4-0 KS 27 (SUTURE) ×3 IMPLANT
TOWEL OR 17X24 6PK STRL BLUE (TOWEL DISPOSABLE) ×3 IMPLANT
TRAY FOLEY W/BAG SLVR 14FR LF (SET/KITS/TRAYS/PACK) ×3 IMPLANT
WATER STERILE IRR 1000ML POUR (IV SOLUTION) ×3 IMPLANT

## 2019-06-13 NOTE — Progress Notes (Signed)
   Subjective: Patient reports intermittent pressure. She is comfortable with her epidural   Objective: BP 102/86   Pulse (!) 124   Temp 98.9 F (37.2 C) (Axillary)   Resp 16   Ht 5\' 3"  (1.6 m)   Wt (!) 166.7 kg   LMP 08/30/2018   SpO2 100%   BMI 65.08 kg/m  I/O last 3 completed shifts: In: -  Out: 2975 [Urine:2975] Total I/O In: 5202 [I.V.:5202] Out: 1400 [Urine:1400]  FHT:  FHR: 140 bpm, variability: moderate,  accelerations:  Present,  decelerations:  Absent UC:   regular, every 2-3 minutes SVE:  8/100/0 Labs: Lab Results  Component Value Date   WBC 8.4 06/12/2019   HGB 10.2 (L) 06/12/2019   HCT 31.8 (L) 06/12/2019   MCV 83.9 06/12/2019   PLT 284 06/12/2019    Assessment / Plan:  protracted active phase with failure to progress suspected cephalopelvic disproportion. D/w pt continuing pitocin vs cesarean section. R/b/a of cesarean section discussed. Including but not limited to infection bleeding damage to bowel bladder baby with the need for further surgery. R/o tranfusion discussed. Pt desires to proceed with primary cesarean section Christophe Louis 06/13/2019, 5:08 PM

## 2019-06-13 NOTE — Anesthesia Postprocedure Evaluation (Signed)
Anesthesia Post Note  Patient: Emily Goodman  Procedure(s) Performed: CESAREAN SECTION (N/A )     Patient location during evaluation: PACU Anesthesia Type: Epidural Level of consciousness: awake, awake and alert and oriented Pain management: pain level controlled Vital Signs Assessment: post-procedure vital signs reviewed and stable Respiratory status: spontaneous breathing, nonlabored ventilation and respiratory function stable Cardiovascular status: stable Postop Assessment: no headache, no backache, epidural receding, patient able to bend at knees, no signs of nausea or vomiting and no apparent nausea or vomiting Anesthetic complications: no    Last Vitals:  Vitals:   06/13/19 2017 06/13/19 2040  BP: 137/87 (!) 146/93  Pulse:  (!) 105  Resp:    Temp: 36.7 C 37.5 C  SpO2: 95% 96%    Last Pain:  Vitals:   06/13/19 2040  TempSrc: Oral  PainSc: 0-No pain   Pain Goal: Patients Stated Pain Goal: 7 (06/12/19 1139)  LLE Motor Response: Purposeful movement (06/13/19 2017) LLE Sensation: Tingling (06/13/19 2017) RLE Motor Response: Purposeful movement (06/13/19 2017) RLE Sensation: Tingling (06/13/19 2017)     Epidural/Spinal Function Cutaneous sensation: Normal sensation (06/13/19 2040), Patient able to flex knees: Yes (06/13/19 2040), Patient able to lift hips off bed: Yes (06/13/19 2040), Back pain beyond tenderness at insertion site: No (06/13/19 2040), Progressively worsening motor and/or sensory loss: No (06/13/19 2040), Bowel and/or bladder incontinence post epidural: No (06/13/19 2040)  Catalina Gravel

## 2019-06-13 NOTE — Transfer of Care (Signed)
Immediate Anesthesia Transfer of Care Note  Patient: Emily Goodman  Procedure(s) Performed: CESAREAN SECTION (N/A )  Patient Location: PACU  Anesthesia Type:Epidural  Level of Consciousness: awake, alert  and oriented  Airway & Oxygen Therapy: Patient Spontanous Breathing  Post-op Assessment: Report given to RN and Post -op Vital signs reviewed and stable  Post vital signs: Reviewed and stable  Last Vitals:  Vitals Value Taken Time  BP 139/94 06/13/19 1905  Temp    Pulse 101 06/13/19 1909  Resp 22 06/13/19 1909  SpO2 100 % 06/13/19 1909  Vitals shown include unvalidated device data.  Last Pain:  Vitals:   06/13/19 1700  TempSrc: Oral  PainSc: 0-No pain      Patients Stated Pain Goal: 7 (07/28/58 4585)  Complications: No apparent anesthesia complications

## 2019-06-13 NOTE — Anesthesia Postprocedure Evaluation (Deleted)
Anesthesia Post Note  Patient: Emily Goodman  Procedure(s) Performed: CESAREAN SECTION (N/A )     Patient location during evaluation: Mother Baby Anesthesia Type: Epidural Level of consciousness: awake and alert Pain management: pain level controlled Vital Signs Assessment: post-procedure vital signs reviewed and stable Respiratory status: spontaneous breathing, nonlabored ventilation and respiratory function stable Cardiovascular status: stable Postop Assessment: no headache, no backache and epidural receding Anesthetic complications: no    Last Vitals:  Vitals:   06/13/19 1700 06/13/19 1720  BP: 116/69 137/88  Pulse: (!) 122 (!) 142  Resp: 18 18  Temp: 37 C   SpO2:      Last Pain:  Vitals:   06/13/19 1700  TempSrc: Oral  PainSc: 0-No pain   Pain Goal: Patients Stated Pain Goal: 7 (06/12/19 1139)                 Alanni Vader

## 2019-06-13 NOTE — Progress Notes (Signed)
  Subjective: In to assess patient an attempt AROM . Per nurse no IV access currently as it came out when changing positions. Pt is comfortable with her epidural  And reports feeling more pressure.   Objective: BP (!) 108/45   Pulse (!) 110   Temp 98.5 F (36.9 C) (Oral)   Resp 18   Ht 5\' 3"  (1.6 m)   Wt (!) 166.7 kg   LMP 08/30/2018   SpO2 100%   BMI 65.08 kg/m  I/O last 3 completed shifts: In: -  Out: 2975 [Urine:2975] No intake/output data recorded.  FHT:  FHR: 140 bpm, variability: moderate,  accelerations:  Present,  decelerations:  Absent UC:   toco not tracing currently  SVE:   Dilation: 6.5 Effacement (%): 100 Station: -3 Exam by:: RN [employer]  Labs: Lab Results  Component Value Date   WBC 8.4 06/12/2019   HGB 10.2 (L) 06/12/2019   HCT 31.8 (L) 06/12/2019   MCV 83.9 06/12/2019   PLT 284 06/12/2019    Assessment / Plan: INduction of labor due to term with LGA fetus  Progressing on pitocin  Labor: Progressing on Pitocin, will continue to increase then AROM Preeclampsia:  labs stable Fetal Wellbeing:  Category I Pain Control:  Epidural I/D:  n/a Anticipated MOD:  undetermined   Christophe Louis 06/13/2019, 8:05 AM

## 2019-06-13 NOTE — Progress Notes (Signed)
   Subjective: Patient is comfortable with her epidural .. feeling a little pressure No lof ..pitocin discontinued 30 minutes ago due to late deceleration. These resolved with cessation of pitocin   Objective: BP 133/78   Pulse (!) 113   Temp 98.5 F (36.9 C) (Oral)   Resp 18   Ht 5\' 3"  (1.6 m)   Wt (!) 166.7 kg   LMP 08/30/2018   SpO2 100%   BMI 65.08 kg/m  I/O last 3 completed shifts: In: -  Out: 2975 [Urine:2975] Total I/O In: 4647.8 [I.V.:4647.8] Out: 400 [Urine:400]  FHT:  FHR: 140 bpm, variability: marked,  accelerations:  Present,  decelerations:  Absent UC:   none SVE:   Dilation: 8 Effacement (%): 100 Station: -2 Exam by:: Dr. Landry Mellow AROM clear fluid IUPC and FSE placed   Labs: Lab Results  Component Value Date   WBC 8.4 06/12/2019   HGB 10.2 (L) 06/12/2019   HCT 31.8 (L) 06/12/2019   MCV 83.9 06/12/2019   PLT 284 06/12/2019    Assessment / Plan: Induction of labor due to term pregnancy with LGA fetus ,  progressing well on pitocin however pitocin discontinued due to late decelerations which resolved   Labor: AROM .Marland Kitchen plan to restart pitocin in 30 minutes at half ( 10 MU) if mvu's less than 200  Preeclampsia:  labs stable Fetal Wellbeing:  Category I Pain Control:  Epidural I/D:  n/a   Christophe Louis 06/13/2019, 11:51 AM

## 2019-06-13 NOTE — Op Note (Signed)
Cesarean Section Procedure Note  Indications: cephalo-pelvic disproportion and failure to progress: arrest of dilation  Pre-operative Diagnosis: 39 week 5 day pregnancy.  Post-operative Diagnosis: same  Surgeon: Christophe Louis M.D.  Assistants: Dr. Janyth Pupa assisted due to the complexity of the case and morbid obesity   Anesthesia: Epidural anesthesia  ASA Class: 2   Procedure Details   The patient was seen in the Holding Room. The risks, benefits, complications, treatment options, and expected outcomes were discussed with the patient.  The patient concurred with the proposed plan, giving informed consent.  The site of surgery properly noted/marked. The patient was taken to Operating Room # c, identified as Adriana Quinby and the procedure verified as C-Section Delivery. A Time Out was held and the above information confirmed.  After induction of anesthesia, the patient was draped and prepped in the usual sterile manner. A Pfannenstiel incision was made and carried down through the subcutaneous tissue to the fascia. Fascial incision was made and extended transversely. The fascia was separated from the underlying rectus tissue superiorly and inferiorly. The peritoneum was identified and entered. Peritoneal incision was extended longitudinally. The utero-vesical peritoneal reflection was incised transversely and the bladder flap was bluntly freed from the lower uterine segment. A low transverse uterine incision was made. Delivered from cephalic presentation was a gram Female with Apgar scores of 9 at one minute and 10 at five minutes. After the umbilical cord was clamped and cut cord blood was obtained for evaluation. The placenta was removed intact and appeared normal. The uterine outline, tubes and ovaries appeared normal. The uterine incision was closed with running locked sutures of 0 vicryl . a second layer of 0 vicryl was used to imbricate the incision. . Hemostasis was observed. Lavage was  carried out until clear. The fascia was then reapproximated with running sutures of 0 pds. . The skin was reapproximated with 4-0 vicryl.  Instrument, sponge, and needle counts were correct prior the abdominal closure and at the conclusion of the case.   Findings: Female infant in cephalic presentation LGA... unofficial weight was 11 lbs 7 oz ..normal fallopian tubes and ovaries   Estimated Blood Loss:  300 mL         Drains: None         Total IV Fluids:  Per anesthesia ml         Specimens: Placenta to labor and delivery           Implants: None         Complications:  None; patient tolerated the procedure well.         Disposition: PACU - hemodynamically stable.         Condition: stable  Attending Attestation: I performed the procedure.

## 2019-06-14 LAB — CBC
HCT: 29.2 % — ABNORMAL LOW (ref 36.0–46.0)
Hemoglobin: 9.5 g/dL — ABNORMAL LOW (ref 12.0–15.0)
MCH: 27.5 pg (ref 26.0–34.0)
MCHC: 32.5 g/dL (ref 30.0–36.0)
MCV: 84.6 fL (ref 80.0–100.0)
Platelets: 234 10*3/uL (ref 150–400)
RBC: 3.45 MIL/uL — ABNORMAL LOW (ref 3.87–5.11)
RDW: 16.3 % — ABNORMAL HIGH (ref 11.5–15.5)
WBC: 13.5 10*3/uL — ABNORMAL HIGH (ref 4.0–10.5)
nRBC: 0 % (ref 0.0–0.2)

## 2019-06-14 NOTE — Lactation Note (Signed)
This note was copied from a baby's chart. Lactation Consultation Note  Patient Name: Emily Goodman [id number] Date: 06/14/2019 Reason for consult: Initial assessment  Set up DEBP and assisted with first pumping.  Rolled up cloth diapers placed under breasts for support.  27 mm flanges a good fit currently.  Mom instructed to use the Initiation setting and to pump both breasts >8 times per 24 hrs or every 2-3 hrs when baby would be feeding.  Mom to keep baby STS as much as possible.  If Mom would like assistance with positioning and latching, to ask for assistance.    Lactation Tools Discussed/Used Tools: Pump Breast pump type: Double-Electric Breast Pump WIC Program: No Pump Review: Setup, frequency, and cleaning;Milk Storage Initiated by:: Cipriano Mile RN IBCLC Date initiated:: 06/14/19   Consult Status Consult Status: Follow-up Date: 06/15/19 Follow-up type: In-patient    Broadus John 06/14/2019, 3:55 PM

## 2019-06-14 NOTE — Clinical Social Work Maternal (Signed)
CLINICAL SOCIAL WORK MATERNAL/CHILD NOTE  Patient Details  Name: Emily Goodman MRN: [id number] Date of Birth: 07/07/1996  Date:  06/14/2019  Clinical Social Worker Initiating Note:  Hortencia PilarKierra Lizzie Cokley, LCSW Date/Time: Initiated:  06/14/19/0905     Child's Name:  Emily Goodman   Biological Parents:  Mother, Father   Need for Interpreter:  None   Reason for Referral:  Current Substance Use/Substance Use During Pregnancy    Address:  [address] [place] [address]    Phone number:  [phone] (home)     Additional phone number: none   Household Members/Support Persons (HM/SP):   Household Member/Support Person 1   HM/SP Name Relationship DOB or Age  HM/SP -1 Emily Goodman MOB   04/23/1996  HM/SP -2  Emily FinchJanis [place]  [facility]  07/30/1966  HM/SP -3        HM/SP -4        HM/SP -5        HM/SP -6        HM/SP -7        HM/SP -8          Natural Supports (not living in the home):  Parent   Professional Supports: None   Employment: Self-employed   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    Cultural/Religious Considerations Which May Impact Care:  none reported.   Strengths:  Compliance with medical plan , Pediatrician chosen, Ability to meet basic needs , Home prepared for child    Psychotropic Medications:none          [facility]   [facility] List:   [facility]    [place]    [place]    [place]    [place]    [place]      Pediatrician Fax Number:    Risk Factors/Current Problems:  None   Cognitive State:  Able to Concentrate    Mood/Affect:  Calm , Comfortable , Relaxed , Interested    CSW Assessment: CSW consulted as MOB has  History of THC as well as THC use earlier in her pregnancy. CSW spoke with MOB at bedside to  address further needs.   CSW congratulated MOB on the birth of infant. MOB was advised by CSW of the HIPPA policy and requested that FOB leave room. FOB left room with no issues. CSW advised MOB of CSW's role and the reason for CSW coming to visit with her. MOB reported that she used THC "maybe two weeks after" confirming pregnancy. MOB reported that this was her last use that she can recall. CSW advised MOB of the hospital drug screen policy and informed MOB that there are two different forms of drug screens that are done. MOB reported that she understood with no further questions.    CS[facility] on her mental health diagnoses. MOB reported that she has anxiety and clinical depression. 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 MOB expressed that she was placed on Prozac at that time however she stopped taking it in 2015 as well. MOB reported that she is not feeling SI or HI at this time. MOB informed CSW that her supports are her mom as well as other family members. MOB expressed that she has  all needed items to care for infant with no further needs at this time.   CSW provided MOB with PPD and SIDS education. MB was given PPD Checklist in order to keep track of feelings as they related to PPD.   CSW will continue to monitor infants CDS UDS for CPS report as needed.   CSW Plan/Description:  No Further Intervention Required/No Barriers to Discharge, Sudden Infant Death Syndrome (SIDS) Education, Perinatal Mood and Anxiety Disorder (PMADs) Education, [place], CSW Will Continue to Monitor Umbilical Cord Tissue Drug Screen Results and Make Report if Mardene Sayer, [place] 06/14/2019, 10:00 AM

## 2019-06-14 NOTE — Lactation Note (Signed)
This note was copied from a baby's chart. Lactation Consultation Note  Patient Name: Emily Goodman [id number] Date: 06/14/2019  P1, 42 hour female infant. LC made 2nd attempt to see mom for [facility] services mom and infant asleep.    Maternal Data    Feeding Feeding Type: Bottle Fed - Formula  LATCH Score                   Interventions    Lactation Tools Discussed/Used     Consult Status      Emily Goodman 06/14/2019, 6:12 AM

## 2019-06-14 NOTE — Lactation Note (Signed)
This note was copied from a baby's chart. Lactation Consultation Note:  Mother is a P1, infant is LGA at 11 lbs 5 ounces.  Mother GDM . Infant is 39 hours old and has had 2 breast attempts , no sustained latch. Infant has been bottle fed 2 times and took 20 ml with each feeding.  Infants last feeding was 3 am. Father reports that he has attempt to bottle feed infant several times but infant refuses.   Mother reports that she wants to breastfeed.   Mother has extremely large breast. She has areola edema.  Assist mother with reverse pressure and hand expression.  Mothers nipples firm and become erect with stimulation.   Mother placed in sitting position in bed and infant on pillows. Infant placed in football hold. Infant rooting and cuing. Multiple attempts to latch infant and unable to sustain latch.  Assessed infants suck reflex and observed that infant has a very high palate and a short lingual frenula. Infants tongue cups.   Mother fit with a #24 NS. Infant latched on and off. He was given 5 ml of formula with a curved tip syringe through the NS. Observed suckling while giving formula.  Attempt to offer more formula with finger feeding , infant gaggy and spitty.  LC demonstrated paced bottle feeding and infant tongue thrust the bottle nipple. Infant placed STS with mother . Advised mother to continue to attempt to breastfeed. Instruct mother in application of NS. Mother had a difficult time with placing NS. Encouraged cue base feeding and frequent STS.  Mother was given [facility] brochure and basic teaching done.  Informed mother that [facility] will set up DEBP and assist with pumping. Mother reports that she has a [place] DEBP .   Patient Name: Boy Correna Meacham [id number] Date: 06/14/2019 Reason for consult: Initial assessment;Difficult latch   Maternal Data Has patient been taught Hand Expression?: Yes Does the patient have breastfeeding experience prior to this delivery?:  No  Feeding Feeding Type: Bottle Fed - Formula  LATCH Score Latch: Repeated attempts needed to sustain latch, nipple held in mouth throughout feeding, stimulation needed to elicit sucking reflex.  Audible Swallowing: None  Type of Nipple: Everted at rest and after stimulation(areola edema)  Comfort (Breast/Nipple): Soft / non-tender  Hold (Positioning): Full assist, staff holds infant at breast  LATCH Score: 5  Interventions    Lactation Tools Discussed/Used Tools: Pump Nipple shield size: 24 Breast pump type: Double-Electric Breast Pump WIC Program: No Pump Review: Setup, frequency, and cleaning;Milk Storage Initiated by:: Cipriano Mile RN IBCLC Date initiated:: 06/14/19   Consult Status Consult Status: Follow-up Date: 06/15/19 Follow-up type: In-patient    [name] [facility] 06/14/2019, 4:37 PM

## 2019-06-14 NOTE — Progress Notes (Signed)
Subjective: Postpartum Day 1: Cesarean Delivery Patient reports incisional pain and tolerating PO.    Objective: Vital signs in last 24 hours: Temp:  [98.1 F (36.7 C)-100.1 F (37.8 C)] 98.4 F (36.9 C) (09/16 0922) Pulse Rate:  [93-142] 101 (09/16 0922) Resp:  [11-20] 18 (09/16 0922) BP: (102-163)/(66-99) 126/70 (09/16 0922) SpO2:  [90 %-100 %] 90 % (09/16 3825)  Physical Exam:  General: alert, cooperative and no distress Lochia: appropriate Uterine Fundus: soft Incision: prevena negative pressure dressing is in place and functioning  DVT Evaluation: No evidence of DVT seen on physical exam.  Recent Labs    06/12/19 0105 06/14/19 0449  HGB 10.2* 9.5*  HCT 31.8* 29.2*    Assessment/Plan: Status post Cesarean section. Doing well postoperatively.  Continue current care Proteinuria.Marland Kitchen pt with elevated bp after delivery however she received methergine during delivery.. plan to monitor BP it is currently normal  Morbid obesity - Lovenox 80 mg [place] for dvt prophylaxis starting this afternoon.  Encourage ambulation  Pt planning outpatient circumcision.Christophe Louis 06/14/2019, 1:44 PM

## 2019-06-14 NOTE — Lactation Note (Signed)
This note was copied from a baby's chart. Lactation Consultation Note  Patient Name: Emily Goodman [id number] Date: 06/14/2019  P1, 6 hour female infant, LGA greater than 9 lbs. LC entered room mom and infant asleep.   Maternal Data    Feeding Feeding Type: Bottle Fed - Formula  LATCH Score                   Interventions    Lactation Tools Discussed/Used     Consult Status      Vicente Serene 06/14/2019, 12:45 AM

## 2019-06-15 NOTE — Progress Notes (Signed)
Subjective: Postpartum Day 2: Cesarean Delivery Patient reports incisional pain, tolerating PO, + flatus and no problems voiding.    Objective: Vital signs in last 24 hours: Temp:  [98.5 F (36.9 C)-99.1 F (37.3 C)] 98.5 F (36.9 C) (09/17 0528) Pulse Rate:  [112-118] 112 (09/17 0528) Resp:  [17-18] 17 (09/17 0528) BP: (115-136)/(67-83) 123/83 (09/17 0528) SpO2:  [94 %-100 %] 100 % (09/17 0528)  Physical Exam:  General: alert, cooperative and no distress Lochia: appropriate Uterine Fundus: firm Incision: Prevena in place and functioning  DVT Evaluation: No evidence of DVT seen on physical exam.  Recent Labs    06/14/19 0449  HGB 9.5*  HCT 29.2*    Assessment/Plan: Status post Cesarean section. Doing well postoperatively.  Continue current care Morbid obesity - Lovenox  Proteinuria in pregnancy normal bp/ elevated after delivery due to methergine. ... prior abnormal bp due to improper cuff  D/C home tomorrow  Appt with Dr. Landry Mellow in 1 week for incision check .  Emily Goodman 06/15/2019, 9:45 AM

## 2019-06-15 NOTE — Progress Notes (Signed)
Patient having increased pain that is managed with medication. Patient is passing flatus but feels like she needs to do more. Most pain is upper abdomen. Educated on side effects of oxycodone. Patient has active bowel sounds in all four quadrants. RN has instructed patient to walk either a small lap or at least down one hallway to help alleviate gas pain. Patient has not walked outside of her room.

## 2019-06-16 MED ORDER — OXYCODONE HCL 5 MG PO TABS: 5.0000 mg | ORAL_TABLET | ORAL | 0 refills | Status: DC | PRN

## 2019-06-16 MED ORDER — IBUPROFEN 800 MG PO TABS: 800.0000 mg | ORAL_TABLET | Freq: Three times a day (TID) | ORAL | 0 refills | Status: AC

## 2019-06-16 NOTE — Discharge Instructions (Signed)

## 2019-06-16 NOTE — Discharge Summary (Signed)
OB Discharge Summary     Patient Name: Emily Goodman DOB: 1996/01/30 MRN: [id number]  Date of admission: 06/12/2019 Delivering MD: Christophe Louis   Date of discharge: 06/16/2019  Admitting diagnosis: pregnancy Intrauterine pregnancy: [redacted]w[redacted]d     Secondary diagnosis:  Active Problems:   Term pregnancy  Additional problems: Morbid obesity (BMI 65)     Discharge diagnosis: Term Pregnancy Delivered                                                                                                Post partum procedures:Lovenox  Augmentation: AROM, Pitocin and Cytotec  Complications: None  Hospital course:  Induction of Labor With Vaginal Delivery   23 y.o. yo G1P1001 at [redacted]w[redacted]d was admitted to the hospital 06/12/2019 for induction of labor.  Indication for induction: Morbid obesity, macrosomia.  Patient had an uncomplicated labor course as follows: Membrane Rupture Time/Date: 11:32 AM ,06/13/2019   Intrapartum Procedures: Episiotomy: None [1]                                         Lacerations:  None [1]  Patient had delivery of a Viable infant.   Postpartum BPs were normal with 3 elevated BPs.  Pt denies headaches, visual changes, upper abdominal pain. Information for the patient's newborn:  [name], [name] [[id number]]      06/13/2019  Details of delivery can be found in separate delivery note.  Patient had a routine postpartum course. Patient is discharged home 06/16/19.  Physical exam  Vitals:   06/15/19 2129 06/16/19 0511 06/16/19 0812 06/16/19 1202  BP: 125/64 134/87 133/85 (!) 141/79  Pulse: (!) 125 (!) 116 (!) 112 (!) 113  Resp: 20 20 20 20   Temp: 99.3 F (37.4 C) 99.6 F (37.6 C) 98.6 F (37 C) 98.3 F (36.8 C)  TempSrc: Oral Oral Oral Oral  SpO2: 100% 98%  100%  Weight:      Height:       General: alert, cooperative and no distress Lochia: appropriate Uterine Fundus: firm, edema in pannus Incision: Prevana in place and operating DVT Evaluation: No evidence of DVT seen  on physical exam. Calf/Ankle edema is present Labs: Lab Results  Component Value Date   WBC 13.5 (H) 06/14/2019   HGB 9.5 (L) 06/14/2019   HCT 29.2 (L) 06/14/2019   MCV 84.6 06/14/2019   PLT 234 06/14/2019   CMP Latest Ref Rng & Units 06/12/2019  Glucose 70 - 99 mg/dL 80  BUN 6 - 20 mg/dL 10  Creatinine 0.44 - 1.00 mg/dL 0.77  Sodium 135 - 145 mmol/L 136  Potassium 3.5 - 5.1 mmol/L 3.9  Chloride 98 - 111 mmol/L 105  CO2 22 - 32 mmol/L 20(L)  Calcium 8.9 - 10.3 mg/dL 9.1  Total Protein 6.5 - 8.1 g/dL 6.0(L)  Total Bilirubin 0.3 - 1.2 mg/dL 0.6  Alkaline Phos 38 - 126 U/L 102  AST 15 - 41 U/L 25  ALT 0 - 44 U/L 14  Discharge instruction: per After Visit Summary and "Baby and Me Booklet".  After visit meds:  Allergies as of 06/16/2019   No Known Allergies     Medication List    TAKE these medications   acetaminophen 500 MG tablet Commonly known as: TYLENOL Take 500 mg by mouth every 6 (six) hours as needed for mild pain or headache.   cetirizine 10 MG tablet Commonly known as: ZYRTEC Take 10 mg by mouth daily as needed for allergies.   ibuprofen 800 MG tablet Commonly known as: ADVIL Take 1 tablet (800 mg total) by mouth every 8 (eight) hours. What changed:   medication strength  how much to take  when to take this  reasons to take this   oxyCODONE 5 MG immediate release tablet Commonly known as: Oxy IR/ROXICODONE Take 1-2 tablets (5-10 mg total) by mouth every 4 (four) hours as needed for moderate pain or breakthrough pain.   prenatal multivitamin Tabs tablet Take 1 tablet by mouth daily at 12 noon.       Diet: routine diet  Activity: Advance as tolerated. Pelvic rest for 6 weeks.   Outpatient follow up:1 week BP check Follow up Appt:No future appointments. Follow up Visit:No follow-ups on file.  Postpartum contraception: Abstinence  Newborn Data: Live born female  Birth Weight: 11 lb 5 oz (5131 g) APGAR: 9, 10  Newborn Delivery   Birth  date/time: 06/13/2019 17:59:00 Delivery type: C-Section, Low Transverse Trial of labor: Yes C-section categorization: Primary      Baby Feeding: Bottle and Breast Disposition:home with mother   06/16/2019 Geryl RankinsEvelyn Henna Derderian, MD

## 2019-06-16 NOTE — Progress Notes (Signed)
Pt walked an entire lap around unit without stopping. Pt states she felt good after walking. Pt states gas pain "has gotten better, but it's still there." This RN encouraged pt to rest for a while and walk another lap around the unit.

## 2019-06-16 NOTE — Lactation Note (Signed)
This note was copied from a baby's chart. Lactation Consultation Note  Patient Name: Emily Goodman [id number] Date: 06/16/2019 Reason for consult: Follow-up assessment  P1 mother whose infant is now 16 hours old.  This is a term LGA infant.  Chart review shows mother has not attempted breast feeding in almost 2 days.  When questioned about her feeding preference at this time mother stated that she still wants to breast feed "when these things start working (pointed to her breasts)."  Educated mother about how her milk will come to volume and the steps needed to encourage a full milk supply.  Mother has not been latching baby or pumping regularly.  She is familiar with hand expression and can get "a couple drop."    Encouraged mother to feed on cue or at least 8-12 times/24 hours.  She will continue supplementing with formula.  Mother was not interested in reviewing engorgement and did not wish to receive a manual pump.  She has a DEBP for home use.  Mother has our OP phone number for questions/concerns after discharge.  Informed her about our OP breastfeeding virtual support group.  Father present.     Maternal Data    Feeding Feeding Type: Bottle Fed - Formula  LATCH Score                   Interventions    Lactation Tools Discussed/Used     Consult Status Consult Status: Complete Date: 06/16/19 Follow-up type: Call as needed    Eleisha Branscomb R Ezrah Panning 06/16/2019, 9:41 AM

## 2019-06-19 DIAGNOSIS — R030 Elevated blood-pressure reading, without diagnosis of hypertension: Secondary | ICD-10-CM | POA: Diagnosis not present

## 2019-06-22 ENCOUNTER — Emergency Department (HOSPITAL_COMMUNITY): Payer: BC Managed Care – PPO

## 2019-06-22 ENCOUNTER — Inpatient Hospital Stay (HOSPITAL_COMMUNITY)
Admission: EM | Admit: 2019-06-22 | Discharge: 2019-06-25 | DRG: 776 | Disposition: A | Discharge status: Home / Self Care | Payer: BC Managed Care – PPO | Attending: Obstetrics and Gynecology | Admitting: Obstetrics and Gynecology

## 2019-06-22 ENCOUNTER — Encounter (HOSPITAL_COMMUNITY): Payer: Self-pay

## 2019-06-22 ENCOUNTER — Other Ambulatory Visit: Payer: Self-pay

## 2019-06-22 DIAGNOSIS — R0602 Shortness of breath: Secondary | ICD-10-CM | POA: Diagnosis not present

## 2019-06-22 DIAGNOSIS — O1495 Unspecified pre-eclampsia, complicating the puerperium: Secondary | ICD-10-CM | POA: Diagnosis not present

## 2019-06-22 DIAGNOSIS — Z20828 Contact with and (suspected) exposure to other viral communicable diseases: Secondary | ICD-10-CM | POA: Diagnosis not present

## 2019-06-22 DIAGNOSIS — O1415 Severe pre-eclampsia, complicating the puerperium: Principal | ICD-10-CM | POA: Diagnosis present

## 2019-06-22 DIAGNOSIS — O1490 Unspecified pre-eclampsia, unspecified trimester: Secondary | ICD-10-CM | POA: Diagnosis present

## 2019-06-22 DIAGNOSIS — O99215 Obesity complicating the puerperium: Secondary | ICD-10-CM | POA: Diagnosis present

## 2019-06-22 DIAGNOSIS — J9811 Atelectasis: Secondary | ICD-10-CM | POA: Diagnosis not present

## 2019-06-22 LAB — HEPATIC FUNCTION PANEL
ALT: 25 U/L (ref 0–44)
AST: 28 U/L (ref 15–41)
Albumin: 2.7 g/dL — ABNORMAL LOW (ref 3.5–5.0)
Alkaline Phosphatase: 82 U/L (ref 38–126)
Bilirubin, Direct: 0.1 mg/dL (ref 0.0–0.2)
Indirect Bilirubin: 0.6 mg/dL (ref 0.3–0.9)
Total Bilirubin: 0.7 mg/dL (ref 0.3–1.2)
Total Protein: 6.6 g/dL (ref 6.5–8.1)

## 2019-06-22 LAB — URINALYSIS, ROUTINE W REFLEX MICROSCOPIC
Bilirubin Urine: NEGATIVE
Glucose, UA: NEGATIVE mg/dL
Ketones, ur: NEGATIVE mg/dL
Nitrite: NEGATIVE
Protein, ur: 30 mg/dL — AB
RBC / HPF: >50 RBC/hpf — ABNORMAL HIGH (ref 0–5)
Specific Gravity, Urine: 1.009 (ref 1.005–1.030)
pH: 8 (ref 5.0–8.0)

## 2019-06-22 LAB — BASIC METABOLIC PANEL
Anion gap: 10 (ref 5–15)
BUN: 8 mg/dL (ref 6–20)
CO2: 25 mmol/L (ref 22–32)
Calcium: 8.6 mg/dL — ABNORMAL LOW (ref 8.9–10.3)
Chloride: 107 mmol/L (ref 98–111)
Creatinine, Ser: 0.81 mg/dL (ref 0.44–1.00)
GFR calc Af Amer: >60 mL/min (ref >60)
GFR calc non Af Amer: >60 mL/min (ref >60)
Glucose, Bld: 89 mg/dL (ref 70–99)
Potassium: 4.2 mmol/L (ref 3.5–5.1)
Sodium: 142 mmol/L (ref 135–145)

## 2019-06-22 LAB — BRAIN NATRIURETIC PEPTIDE: B Natriuretic Peptide: 213.3 pg/mL — ABNORMAL HIGH (ref 0.0–100.0)

## 2019-06-22 MED ORDER — IBUPROFEN 600 MG PO TABS
600.0000 mg | ORAL_TABLET | Freq: Four times a day (QID) | ORAL | Status: DC | PRN
  Filled 2019-06-22: qty 1

## 2019-06-22 MED ORDER — NIFEDIPINE 10 MG PO CAPS
10.0000 mg | ORAL_CAPSULE | ORAL | Status: DC | PRN
  Administered 2019-06-22: 10 mg via ORAL
  Filled 2019-06-22 (×2): qty 1

## 2019-06-22 MED ORDER — LABETALOL HCL 5 MG/ML IV SOLN: 40.0000 mg | INTRAVENOUS | Status: DC | PRN

## 2019-06-22 MED ORDER — MAGNESIUM SULFATE BOLUS VIA INFUSION
4.0000 g | Freq: Once | INTRAVENOUS | Status: AC
  Administered 2019-06-22: 4 g via INTRAVENOUS
  Filled 2019-06-22: qty 500

## 2019-06-22 MED ORDER — ACETAMINOPHEN 500 MG PO TABS
1000.0000 mg | ORAL_TABLET | Freq: Once | ORAL | Status: AC
  Administered 2019-06-22: 21:00:00 1000 mg via ORAL
  Filled 2019-06-22: qty 2

## 2019-06-22 MED ORDER — MAGNESIUM SULFATE 40 G IN LACTATED RINGERS - SIMPLE
1.0000 g/h | INTRAVENOUS | Status: DC
  Filled 2019-06-22: qty 500

## 2019-06-22 MED ORDER — NIFEDIPINE 10 MG PO CAPS
20.0000 mg | ORAL_CAPSULE | ORAL | Status: DC | PRN
  Filled 2019-06-22: qty 2

## 2019-06-22 MED ORDER — MAGNESIUM SULFATE 40 G IN LACTATED RINGERS - SIMPLE
2.0000 g/h | INTRAVENOUS | Status: AC
  Administered 2019-06-22: 1 g/h via INTRAVENOUS
  Filled 2019-06-22: qty 500

## 2019-06-22 MED ORDER — LACTATED RINGERS IV SOLN
INTRAVENOUS | Status: DC
  Administered 2019-06-22: 23:00:00 50 mL/h via INTRAVENOUS
  Administered 2019-06-23: 18:00:00 via INTRAVENOUS

## 2019-06-22 NOTE — ED Notes (Signed)
Attempted to call nurse at [facility], will return call in 10 minutes per request

## 2019-06-22 NOTE — ED Provider Notes (Signed)
[facility]-EMERGENCY DEPT Provider Note   CSN: [id number] Arrival date & time: 06/22/19  1752     History   Chief Complaint Chief Complaint  Patient presents with  . Shortness of Breath    Post op    HPI Emily Goodman is a 23 y.o. female.     Pt presents to the ED today with headache, blurry vision, leg swelling.  Pt has also had a cough.  Pt had a c-section on 9/15 by Dr. Richardson Doppole [facility]   She was d/c on 9/18.  The pt does not have underlying HTN.  She has had low grade fevers.  She had a negative covid on 9/12.  The pt has not had any known covid exposures.      Past Medical History:  Diagnosis Date  . Anemia   . Migraines     Patient Active Problem List   Diagnosis Date Noted  . Term pregnancy 06/12/2019    Past Surgical History:  Procedure Laterality Date  . CESAREAN SECTION N/A 06/13/2019   Procedure: CESAREAN SECTION;  Surgeon: Gerald Leitzole, Tara, MD;  [facility];  Service: Obstetrics;  Laterality: N/A;  . NO PAST SURGERIES       OB History    Gravida  1   Para  1   Term  1   Preterm  0   AB  0   Living  1     SAB  0   TAB  0   Ectopic  0   Multiple  0   Live Births  1            Home Medications    Prior to Admission medications   Medication Sig Start Date End Date Taking? Authorizing Provider  acetaminophen (TYLENOL) 500 MG tablet Take 500 mg by mouth every 6 (six) hours as needed for mild pain or headache.    [provider]  cetirizine (ZYRTEC) 10 MG tablet Take 10 mg by mouth daily as needed for allergies.    [provider]  ibuprofen (ADVIL) 800 MG tablet Take 1 tablet (800 mg total) by mouth every 8 (eight) hours. 06/16/19   Geryl RankinsVarnado, Evelyn, MD  oxyCODONE (OXY IR/ROXICODONE) 5 MG immediate release tablet Take 1-2 tablets (5-10 mg total) by mouth every 4 (four) hours as needed for moderate pain or breakthrough pain. 06/16/19   Geryl RankinsVarnado, Evelyn, MD  Prenatal Vit-Fe Fumarate-FA (PRENATAL  MULTIVITAMIN) TABS tablet Take 1 tablet by mouth daily at 12 noon.    [provider]    Family History Family History  Problem Relation Age of Onset  . Heart attack Father   . Hypertension Father   . Kidney disease Father   . Stroke Maternal Grandmother   . Diabetes Maternal Grandfather     Social History Social History   Tobacco Use  . Smoking status: Never Smoker  . Smokeless tobacco: Never Used  Substance Use Topics  . Alcohol use: No  . Drug use: Not Currently    Types: Marijuana     Allergies   Patient has no known allergies.   Review of Systems Review of Systems  Eyes: Positive for visual disturbance.  Respiratory: Positive for cough.   Cardiovascular: Positive for leg swelling.  Neurological: Positive for headaches.  All other systems reviewed and are negative.    Physical Exam Updated Vital Signs BP (!) 155/99   Pulse 99   Temp 99.9 F (37.7 C) (Oral)   Resp  16   Ht 5\' 3"  (1.6 m)   Wt 136.1 kg   LMP 08/30/2018   SpO2 100%   BMI 53.14 kg/m   Physical Exam Vitals signs and nursing note reviewed.  Constitutional:      Appearance: She is well-developed. She is obese.  HENT:     Head: Normocephalic and atraumatic.     Mouth/Throat:     Mouth: Mucous membranes are moist.  Eyes:     Extraocular Movements: Extraocular movements intact.     Pupils: Pupils are equal, round, and reactive to light.     Comments: Pt asked me to type in her password for her phone and to call her mom b/c she could not see the numbers.   Neck:     Musculoskeletal: Normal range of motion and neck supple.  Cardiovascular:     Rate and Rhythm: Normal rate and regular rhythm.  Pulmonary:     Effort: Pulmonary effort is normal.     Breath sounds: Normal breath sounds.  Abdominal:     General: Bowel sounds are normal.     Palpations: Abdomen is soft.  Musculoskeletal: Normal range of motion.     Right lower leg: Edema present.     Left lower leg: Edema  present.  Skin:    General: Skin is warm.     Capillary Refill: Capillary refill takes less than 2 seconds.  Neurological:     General: No focal deficit present.     Mental Status: She is alert and oriented to person, place, and time.  Psychiatric:        Mood and Affect: Mood normal.        Behavior: Behavior normal.      ED Treatments / Results  Labs (all labs ordered are listed, but only abnormal results are displayed) Labs Reviewed  BASIC METABOLIC PANEL - Abnormal; Notable for the following components:      Result Value   Calcium 8.6 (*)    All other components within normal limits  URINALYSIS, ROUTINE W REFLEX MICROSCOPIC - Abnormal; Notable for the following components:   APPearance HAZY (*)    Hgb urine dipstick LARGE (*)    Protein, ur 30 (*)    Leukocytes,Ua LARGE (*)    RBC / HPF >50 (*)    Bacteria, UA RARE (*)    All other components within normal limits  HEPATIC FUNCTION PANEL - Abnormal; Notable for the following components:   Albumin 2.7 (*)    All other components within normal limits  BRAIN NATRIURETIC PEPTIDE    EKG None  Radiology Dg Chest 2 View  Result Date: 06/22/2019 CLINICAL DATA:  23 year old female with a history of blurred vision EXAM: CHEST - 2 VIEW COMPARISON:  November 20, 2015 FINDINGS: Cardiomediastinal silhouette unchanged in size and contour. Low lung volumes. Coarsened interstitial markings bilaterally. No large pleural effusion. No pneumothorax. No confluent airspace disease. IMPRESSION: Low lung volumes with coarsened interstitial markings, potentially atelectasis, early edema, or atypical infection. Electronically Signed   By: November 22, 2015 D.O.   On: 06/22/2019 20:17    Procedures Procedures (including critical care time)  Medications Ordered in ED Medications  NIFEdipine (PROCARDIA) capsule 10 mg (10 mg Oral Given 06/22/19 2112)    And  NIFEdipine (PROCARDIA) capsule 20 mg (has no administration in time range)    And   NIFEdipine (PROCARDIA) capsule 20 mg (has no administration in time range)    And  labetalol (NORMODYNE) injection 40 mg (  has no administration in time range)  magnesium sulfate 40 grams in LR 500 mL OB infusion (1 g/hr Intravenous New Bag/Given 06/22/19 2114)  magnesium bolus via infusion 4 g (4 g Intravenous Bolus from Bag 06/22/19 2115)  acetaminophen (TYLENOL) tablet 1,000 mg (1,000 mg Oral Given 06/22/19 2113)     Initial Impression / Assessment and Plan / ED Course  I have reviewed the triage vital signs and the nursing notes.  Pertinent labs & imaging results that were available during my care of the patient were reviewed by me and considered in my medical decision making (see chart for details).    I have a high suspicion of postpartum preeclampsia.  I spoke with Dr. Nelda Marseille Sadie Haber) who spoke with Dr. Everett Graff at [facility].  She said to start procardia po and do the preeclampsia order set.  This was done.  Pt also started on magnesium.  Pt's bp is improving.  [facility]  CRITICAL CARE Performed by: Isla Pence   Total critical care time: 30 minutes  Critical care time was exclusive of separately billable procedures and treating other patients.  Critical care was necessary to treat or prevent imminent or life-threatening deterioration.  Critical care was time spent personally by me on the following activities: development of treatment plan with patient and/or surrogate as well as nursing, discussions with consultants, evaluation of patient's response to treatment, examination of patient, obtaining history from patient or surrogate, ordering and performing treatments and interventions, ordering and review of laboratory studies, ordering and review of radiographic studies, pulse oximetry and re-evaluation of patient's condition.     Final Clinical Impressions(s) / ED Diagnoses   Final diagnoses:  Preeclampsia in postpartum period    ED  Discharge Orders    None       Isla Pence, MD 06/22/19 2136

## 2019-06-22 NOTE — ED Notes (Signed)
[facility] called for transport. 

## 2019-06-22 NOTE — ED Triage Notes (Signed)
Pt arrived stating that she came home from having a c-section 5 days ago. This morning she had blurry vision, cough, chills, and shortness of breath.

## 2019-06-22 NOTE — ED Notes (Signed)
Paperwork printed for [employer] 

## 2019-06-22 NOTE — ED Notes (Signed)
Urine sample possibly contaminated with vaginal bleeding

## 2019-06-23 ENCOUNTER — Other Ambulatory Visit: Payer: Self-pay

## 2019-06-23 DIAGNOSIS — O1490 Unspecified pre-eclampsia, unspecified trimester: Secondary | ICD-10-CM | POA: Diagnosis present

## 2019-06-23 LAB — CBC
HCT: 30.8 % — ABNORMAL LOW (ref 36.0–46.0)
HCT: 29.3 % — ABNORMAL LOW (ref 36.0–46.0)
Hemoglobin: 10.1 g/dL — ABNORMAL LOW (ref 12.0–15.0)
Hemoglobin: 9.2 g/dL — ABNORMAL LOW (ref 12.0–15.0)
MCH: 26.9 pg (ref 26.0–34.0)
MCH: 26.4 pg (ref 26.0–34.0)
MCHC: 32.8 g/dL (ref 30.0–36.0)
MCHC: 31.4 g/dL (ref 30.0–36.0)
MCV: 81.9 fL (ref 80.0–100.0)
MCV: 84.2 fL (ref 80.0–100.0)
Platelets: 496 10*3/uL — ABNORMAL HIGH (ref 150–400)
Platelets: 507 10*3/uL — ABNORMAL HIGH (ref 150–400)
RBC: 3.76 MIL/uL — ABNORMAL LOW (ref 3.87–5.11)
RBC: 3.48 MIL/uL — ABNORMAL LOW (ref 3.87–5.11)
RDW: 15.8 % — ABNORMAL HIGH (ref 11.5–15.5)
RDW: 15.9 % — ABNORMAL HIGH (ref 11.5–15.5)
WBC: 11.6 10*3/uL — ABNORMAL HIGH (ref 4.0–10.5)
WBC: 10.8 10*3/uL — ABNORMAL HIGH (ref 4.0–10.5)
nRBC: 0.3 % — ABNORMAL HIGH (ref 0.0–0.2)
nRBC: 0 % (ref 0.0–0.2)

## 2019-06-23 LAB — BASIC METABOLIC PANEL
Anion gap: 13 (ref 5–15)
BUN: 7 mg/dL (ref 6–20)
CO2: 23 mmol/L (ref 22–32)
Calcium: 8.8 mg/dL — ABNORMAL LOW (ref 8.9–10.3)
Chloride: 101 mmol/L (ref 98–111)
Creatinine, Ser: 0.78 mg/dL (ref 0.44–1.00)
GFR calc Af Amer: >60 mL/min (ref >60)
GFR calc non Af Amer: >60 mL/min (ref >60)
Glucose, Bld: 121 mg/dL — ABNORMAL HIGH (ref 70–99)
Potassium: 4 mmol/L (ref 3.5–5.1)
Sodium: 137 mmol/L (ref 135–145)

## 2019-06-23 LAB — COMPREHENSIVE METABOLIC PANEL
ALT: 23 U/L (ref 0–44)
AST: 24 U/L (ref 15–41)
Albumin: 2.6 g/dL — ABNORMAL LOW (ref 3.5–5.0)
Alkaline Phosphatase: 86 U/L (ref 38–126)
Anion gap: 11 (ref 5–15)
BUN: 5 mg/dL — ABNORMAL LOW (ref 6–20)
CO2: 25 mmol/L (ref 22–32)
Calcium: 8.4 mg/dL — ABNORMAL LOW (ref 8.9–10.3)
Chloride: 102 mmol/L (ref 98–111)
Creatinine, Ser: 0.85 mg/dL (ref 0.44–1.00)
GFR calc Af Amer: >60 mL/min (ref >60)
GFR calc non Af Amer: >60 mL/min (ref >60)
Glucose, Bld: 100 mg/dL — ABNORMAL HIGH (ref 70–99)
Potassium: 4.1 mmol/L (ref 3.5–5.1)
Sodium: 138 mmol/L (ref 135–145)
Total Bilirubin: 0.6 mg/dL (ref 0.3–1.2)
Total Protein: 6.8 g/dL (ref 6.5–8.1)

## 2019-06-23 LAB — MAGNESIUM: Magnesium: 4.7 mg/dL — ABNORMAL HIGH (ref 1.7–2.4)

## 2019-06-23 LAB — SARS CORONAVIRUS 2 BY RT PCR (HOSPITAL ORDER, PERFORMED IN ~~LOC~~ HOSPITAL LAB): SARS Coronavirus 2: NEGATIVE

## 2019-06-23 LAB — URIC ACID: Uric Acid, Serum: 6.9 mg/dL (ref 2.5–7.1)

## 2019-06-23 MED ORDER — SIMETHICONE 80 MG PO CHEW: 80.0000 mg | CHEWABLE_TABLET | ORAL | Status: DC | PRN

## 2019-06-23 MED ORDER — ENOXAPARIN SODIUM 80 MG/0.8ML ~~LOC~~ SOLN
0.5000 mg/kg | SUBCUTANEOUS | Status: DC
  Administered 2019-06-23: 75 mg via SUBCUTANEOUS
  Filled 2019-06-23: qty 0.8

## 2019-06-23 MED ORDER — DIBUCAINE (PERIANAL) 1 % EX OINT: 1.0000 "application " | TOPICAL_OINTMENT | CUTANEOUS | Status: DC | PRN

## 2019-06-23 MED ORDER — TOPIRAMATE 25 MG PO TABS
25.0000 mg | ORAL_TABLET | Freq: Every day | ORAL | Status: DC
  Administered 2019-06-23 – 2019-06-24 (×3): 25 mg via ORAL
  Filled 2019-06-23 (×4): qty 1

## 2019-06-23 MED ORDER — COCONUT OIL OIL: 1.0000 "application " | TOPICAL_OIL | Status: DC | PRN

## 2019-06-23 MED ORDER — IBUPROFEN 600 MG PO TABS
600.0000 mg | ORAL_TABLET | Freq: Four times a day (QID) | ORAL | Status: DC | PRN
  Administered 2019-06-23 (×3): 600 mg via ORAL
  Filled 2019-06-23 (×2): qty 1

## 2019-06-23 MED ORDER — ZOLPIDEM TARTRATE 5 MG PO TABS
5.0000 mg | ORAL_TABLET | Freq: Every evening | ORAL | Status: DC | PRN
  Administered 2019-06-24: 5 mg via ORAL
  Filled 2019-06-23: qty 1

## 2019-06-23 MED ORDER — TETANUS-DIPHTH-ACELL PERTUSSIS 5-2.5-18.5 LF-MCG/0.5 IM SUSP: 0.5000 mL | Freq: Once | INTRAMUSCULAR | Status: DC

## 2019-06-23 MED ORDER — SENNOSIDES-DOCUSATE SODIUM 8.6-50 MG PO TABS
2.0000 | ORAL_TABLET | ORAL | Status: DC
  Administered 2019-06-23: 2 via ORAL
  Filled 2019-06-23 (×2): qty 2

## 2019-06-23 MED ORDER — ENOXAPARIN SODIUM 80 MG/0.8ML ~~LOC~~ SOLN
80.0000 mg | SUBCUTANEOUS | Status: DC
  Administered 2019-06-24 – 2019-06-25 (×2): 80 mg via SUBCUTANEOUS
  Filled 2019-06-23 (×2): qty 0.8

## 2019-06-23 MED ORDER — ACETAMINOPHEN 325 MG PO TABS
650.0000 mg | ORAL_TABLET | ORAL | Status: DC | PRN
  Administered 2019-06-24 (×2): 650 mg via ORAL
  Filled 2019-06-23 (×2): qty 2

## 2019-06-23 MED ORDER — DIPHENHYDRAMINE HCL 25 MG PO CAPS: 25.0000 mg | ORAL_CAPSULE | Freq: Four times a day (QID) | ORAL | Status: DC | PRN

## 2019-06-23 MED ORDER — SIMETHICONE 80 MG PO CHEW
80.0000 mg | CHEWABLE_TABLET | Freq: Three times a day (TID) | ORAL | Status: DC
  Administered 2019-06-24 – 2019-06-25 (×4): 80 mg via ORAL
  Filled 2019-06-23 (×4): qty 1

## 2019-06-23 MED ORDER — PRENATAL MULTIVITAMIN CH
1.0000 | ORAL_TABLET | Freq: Every day | ORAL | Status: DC
  Administered 2019-06-24: 1 via ORAL
  Filled 2019-06-23: qty 1

## 2019-06-23 MED ORDER — MENTHOL 3 MG MT LOZG: 1.0000 | LOZENGE | OROMUCOSAL | Status: DC | PRN

## 2019-06-23 MED ORDER — WITCH HAZEL-GLYCERIN EX PADS: 1.0000 "application " | MEDICATED_PAD | CUTANEOUS | Status: DC | PRN

## 2019-06-23 MED ORDER — OXYCODONE HCL 5 MG PO TABS
5.0000 mg | ORAL_TABLET | ORAL | Status: DC | PRN
  Administered 2019-06-23 (×2): 5 mg via ORAL
  Filled 2019-06-23 (×2): qty 1

## 2019-06-23 MED ORDER — SIMETHICONE 80 MG PO CHEW
80.0000 mg | CHEWABLE_TABLET | ORAL | Status: DC
  Administered 2019-06-23: 80 mg via ORAL
  Filled 2019-06-23 (×2): qty 1

## 2019-06-23 NOTE — MAU Note (Signed)
Covid swab obtained without difficulty and pt tol well. No symptoms 

## 2019-06-23 NOTE — H&P (Signed)
Emily Goodman is a 23 y.o. female presents to [facility] ED for SOB, HA, visual changes, and BLE. .BP in severe range. MgSO4 started @ [facility] ED. See [facility] ED note for details. Pt is G1P1 S/P PCS on 9/15. Preg complicated by morbid obesity. Unable to obtain baseline vision bc wears glasses and lost them after delivery. Hx of migraines, treated w/ Topramax. Pt endorses that this HA feels like her usual migraine. Labs, orders, and chest x-ray reviewed.   OB History    Gravida  1   Para  1   Term  1   Preterm  0   AB  0   Living  1     SAB  0   TAB  0   Ectopic  0   Multiple  0   Live Births  1          Past Medical History:  Diagnosis Date  . Anemia   . Migraines    Past Surgical History:  Procedure Laterality Date  . CESAREAN SECTION N/A 06/13/2019   Procedure: CESAREAN SECTION;  Surgeon: Gerald Leitz, MD;  Location: [facility] LD ORS;  Service: Obstetrics;  Laterality: N/A;  . NO PAST SURGERIES     Family History: family history includes Diabetes in her maternal grandfather; Heart attack in her father; Hypertension in her father; Kidney disease in her father; Stroke in her maternal grandmother. Social History:  reports that she has never smoked. She has never used smokeless tobacco. She reports previous alcohol use. She reports previous drug use. Drug: Marijuana.   Review of Systems  Eyes: Positive for blurred vision.  Respiratory: Positive for shortness of breath.   Cardiovascular: Positive for leg swelling.  Neurological: Positive for headaches.     Blood pressure (!) 149/84, pulse (!) 107, temperature 98.2 F (36.8 C), temperature source Oral, resp. rate 18, height 5\' 3"  (1.6 m), weight 136.1 kg, last menstrual period 08/30/2018, SpO2 95 %, unknown if currently breastfeeding. Exam Physical Exam  Lungs - decreased BS throughout but clear CV RRR Abd soft, NT, incision c/d/i Ext SCDs on, 2-3+ pitting edema, no clonus, hyperreflexive, no calf tenderness to palpation (but pt says legs  are sore)  Prenatal labs: ABO, Rh: --/--/O POS, O POS Performed at [facility], [address] [place], [place]  [phone]) Antibody: NEG (09/14 0105) Rubella: Immune (02/12 0000) RPR: NON REACTIVE (09/14 0105)  HBsAg: Negative (02/12 0000)  HIV: Non-reactive (02/12 0000)  GBS: Negative/-- (08/25 0000)   Assessment/Plan: 22yo s/p c-section about 1wk ago transferred from [facility] with PP Preeclampsia and already started on MgSO4.  I (Dr.Rami Waddle) was called by Dr. [facility] with this report and I notified OB specialty care of direct admission.   Preeclampsia - add on labs, cont Mg for at least 24 hrs, strict Is/Os.  Repeat labs in am, BP med protocol per severe range BPs Resp/SOB - cxray with differential of bilateral atelectasis and IS encouraged, atypical infection (afebrile and neg covid), vs edema.  O2 sat decreased on RA, placed on O2 with sats about 95-96%.  Pt needs sleep study if has not already had one, likely has sleep apnea and was awakened for eval Lovenox for DVT prophylaxis H/o migraines vs HA of preeclampsia - will restart pt's topamax.  Pt plans to pump in hospital but reports milk has not come in so she has been bottle feeding.  HA resolved after tylenol and ibuprofen. Visual changes reported but pt also wears glasses  and has not worn them for a week because she said she lost them.  Difficult to evaluate.   [name] 06/23/2019, 12:10 AM  PE and A/P addended by me.  Will sign pt out to [facility] later this morning.

## 2019-06-23 NOTE — Progress Notes (Signed)
Subjective: Postpartum Day 10: Cesarean Delivery readmitted overnight with postpartum preeclampsia  Patient reports blurred vision and sob have resolved. She has a mild headache. She reports feeling much better.   Objective: Vital signs in last 24 hours: Temp:  [98.2 F (36.8 C)-99.9 F (37.7 C)] 98.2 F (36.8 C) (09/25 1633) Pulse Rate:  [87-124] 106 (09/25 1633) Resp:  [15-36] 20 (09/25 1633) BP: (132-192)/(74-135) 132/74 (09/25 1633) SpO2:  [87 %-100 %] 94 % (09/25 1633) Weight:  [136.1 kg-154.8 kg] 154.8 kg (09/25 0010)  Physical Exam:  General: alert, cooperative and no distress Lochia: inappropriate Uterine Fundus: firm Incision: healing well DVT Evaluation: No evidence of DVT seen on physical exam.@+ edema.   Recent Labs    06/23/19 0046 06/23/19 0643  HGB 10.1* 9.2*  HCT 30.8* 29.3*    Assessment/Plan: HD #1 postpartum preeclampsia.Marland Kitchen continue magnesium sulfate. Plan to discontinue at 9 pm. Will monitor bp careful.. plan to start procardia if bp elevated after magnesium is discontinued.  - Daily Weights  -Strict I/0's  - PIH labs in am    Christophe Louis 06/23/2019, 6:16 PM

## 2019-06-23 NOTE — Plan of Care (Signed)
  Problem: Activity: Goal: Risk for activity intolerance will decrease Outcome: Completed/Met

## 2019-06-23 NOTE — Plan of Care (Signed)
  Problem: Activity: Goal: Risk for activity intolerance will decrease Outcome: Completed/Met   Problem: Nutrition: Goal: Adequate nutrition will be maintained Outcome: Completed/Met   Problem: Coping: Goal: Level of anxiety will decrease Outcome: Completed/Met   Problem: Elimination: Goal: Will not experience complications related to bowel motility Outcome: Completed/Met Goal: Will not experience complications related to urinary retention Outcome: Completed/Met

## 2019-06-23 NOTE — Plan of Care (Signed)
  Problem: Education: Goal: Knowledge of disease or condition will improve Outcome: Progressing   Problem: Fluid Volume: Goal: Peripheral tissue perfusion will improve Outcome: Progressing   Problem: Education: Goal: Knowledge of General Education information will improve Description: Including pain rating scale, medication(s)/side effects and non-pharmacologic comfort measures Outcome: Completed/Met   Problem: Clinical Measurements: Goal: Respiratory complications will improve Outcome: Completed/Met   Problem: Safety: Goal: Ability to remain free from injury will improve Outcome: Completed/Met   Problem: Skin Integrity: Goal: Risk for impaired skin integrity will decrease Outcome: Completed/Met

## 2019-06-24 LAB — COMPREHENSIVE METABOLIC PANEL
ALT: 22 U/L (ref 0–44)
AST: 31 U/L (ref 15–41)
Albumin: 2.5 g/dL — ABNORMAL LOW (ref 3.5–5.0)
Alkaline Phosphatase: 78 U/L (ref 38–126)
Anion gap: 10 (ref 5–15)
BUN: 8 mg/dL (ref 6–20)
CO2: 25 mmol/L (ref 22–32)
Calcium: 7.7 mg/dL — ABNORMAL LOW (ref 8.9–10.3)
Chloride: 104 mmol/L (ref 98–111)
Creatinine, Ser: 0.9 mg/dL (ref 0.44–1.00)
GFR calc Af Amer: >60 mL/min (ref >60)
GFR calc non Af Amer: >60 mL/min (ref >60)
Glucose, Bld: 91 mg/dL (ref 70–99)
Potassium: 3.8 mmol/L (ref 3.5–5.1)
Sodium: 139 mmol/L (ref 135–145)
Total Bilirubin: 0.5 mg/dL (ref 0.3–1.2)
Total Protein: 6.4 g/dL — ABNORMAL LOW (ref 6.5–8.1)

## 2019-06-24 LAB — CBC
HCT: 30.4 % — ABNORMAL LOW (ref 36.0–46.0)
Hemoglobin: 9.5 g/dL — ABNORMAL LOW (ref 12.0–15.0)
MCH: 26.2 pg (ref 26.0–34.0)
MCHC: 31.3 g/dL (ref 30.0–36.0)
MCV: 83.7 fL (ref 80.0–100.0)
Platelets: 497 10*3/uL — ABNORMAL HIGH (ref 150–400)
RBC: 3.63 MIL/uL — ABNORMAL LOW (ref 3.87–5.11)
RDW: 15.9 % — ABNORMAL HIGH (ref 11.5–15.5)
WBC: 8.2 10*3/uL (ref 4.0–10.5)
nRBC: 0 % (ref 0.0–0.2)

## 2019-06-24 MED ORDER — NIFEDIPINE ER OSMOTIC RELEASE 30 MG PO TB24
30.0000 mg | ORAL_TABLET | Freq: Every day | ORAL | Status: DC
  Administered 2019-06-24 – 2019-06-25 (×2): 30 mg via ORAL
  Filled 2019-06-24 (×2): qty 1

## 2019-06-24 NOTE — Lactation Note (Signed)
Lactation Consultation Note:  Mother is a re-admit to r/o preeclampsia.  Mother is a P42, she del a female on 9-17. Mother is exclusively pumping.  She is using a #24 flange. She had just pumped when I arrived in the room. Observed approx. 45-50 ml on bedside table.   Mother had extremely  large breast. She was never able to get infant latched.  Mother has been pumping with a [employer]in[employer] pump and reports that she knew that the pump didn't empty her breast.  She reports that her Insurance approved a [employer] and she obtained it yesterday.  Mother reports that she is pumping every 4 hours when her infant is taking a bottle of mostly formula.  Advised mother to start pumping at least every 2-3 hours up to 6-8 times daily.  Mother very receptive to teaching.  Mother informed of available services and informed her to have [facility] paged as needed. Mother was given [facility] brochure and reviewed collection, storage and transportation.   Patient Name: Emily Goodman [id number] Date: 06/24/2019     Maternal Data    Feeding    LATCH Score                   Interventions    Lactation Tools Discussed/Used     Consult Status      [name] 06/24/2019, 10:20 AM

## 2019-06-24 NOTE — Progress Notes (Signed)
Pt called out for RN at 1820, stating that she had a tingling sensation in both of her arms and her face. Pt able to move both arms and denies any numbness, headache, blurred vision, and right epigastric pain. BP 138/74. Dr. Landry Mellow made aware. No new orders received at this time. Told to encourage pt to ambulate and change positions. Also told to continue to monitor symptoms.

## 2019-06-24 NOTE — Progress Notes (Signed)
Subjective: Postpartum Day 11: Cesarean Delivery Patient reports tolerating PO, + flatus and no problems voiding.   Patient denies headache or sob she reports vision has improved but still blurred at times not constant. She does wear glasses and does not have them with her. She reports that she does not need the glasses all the time   Objective: Vital signs in last 24 hours: Temp:  [98.2 F (36.8 C)-98.7 F (37.1 C)] 98.6 F (37 C) (09/26 0800) Pulse Rate:  [90-106] 90 (09/26 0800) Resp:  [18-22] 18 (09/26 0800) BP: (131-157)/(74-102) 157/102 (09/26 0800) SpO2:  [89 %-98 %] 95 % (09/26 0800) Weight:  [151 kg] 151 kg (09/26 0645)  Physical Exam:  General: alert, cooperative and no distress Lochia: appropriate Uterine Fundus: firm Incision: healing well DVT Evaluation: No evidence of DVT seen on physical exam. No pitting edema  Recent Labs    06/23/19 0643 06/24/19 0525  HGB 9.2* 9.5*  HCT 29.3* 30.4*   Results for orders placed or performed during the hospital encounter of 06/22/19 (from the past 24 hour(s))  CBC     Status: Abnormal   Collection Time: 06/24/19  5:25 AM  Result Value Ref Range   WBC 8.2 4.0 - 10.5 K/uL   RBC 3.63 (L) 3.87 - 5.11 MIL/uL   Hemoglobin 9.5 (L) 12.0 - 15.0 g/dL   HCT 30.4 (L) 36.0 - 46.0 %   MCV 83.7 80.0 - 100.0 fL   MCH 26.2 26.0 - 34.0 pg   MCHC 31.3 30.0 - 36.0 g/dL   RDW 15.9 (H) 11.5 - 15.5 %   Platelets 497 (H) 150 - 400 K/uL   nRBC 0.0 0.0 - 0.2 %  Comprehensive metabolic panel     Status: Abnormal   Collection Time: 06/24/19  5:25 AM  Result Value Ref Range   Sodium 139 135 - 145 mmol/L   Potassium 3.8 3.5 - 5.1 mmol/L   Chloride 104 98 - 111 mmol/L   CO2 25 22 - 32 mmol/L   Glucose, Bld 91 70 - 99 mg/dL   BUN 8 6 - 20 mg/dL   Creatinine, Ser 0.90 0.44 - 1.00 mg/dL   Calcium 7.7 (L) 8.9 - 10.3 mg/dL   Total Protein 6.4 (L) 6.5 - 8.1 g/dL   Albumin 2.5 (L) 3.5 - 5.0 g/dL   AST 31 15 - 41 U/L   ALT 22 0 - 44 U/L   Alkaline  Phosphatase 78 38 - 126 U/L   Total Bilirubin 0.5 0.3 - 1.2 mg/dL   GFR calc non Af Amer >60 >60 mL/min   GFR calc Af Amer >60 >60 mL/min   Anion gap 10 5 - 15    Assessment/Plan: Postpartum preeclampsia with severe features - improving. S/p 24 hours of magnesium  6 lb weight loss in 24 hours.  BP elevated. Procardia xl 30 mg started this AM will monitor bp     Emily Goodman 06/24/2019, 11:21 AM

## 2019-06-24 NOTE — Progress Notes (Signed)
Pt states she has had 3 urine occurrences since 1 am this morning. Placed urine hat back in toilet and reminded pt to urinate in hat when she goes.

## 2019-06-25 MED ORDER — NIFEDIPINE ER 30 MG PO TB24: 30.0000 mg | ORAL_TABLET | Freq: Every day | ORAL | 2 refills | Status: DC

## 2019-06-25 NOTE — Progress Notes (Signed)
Discharge instructions and prescriptions given to pt. Discussed signs and symptoms to report to the MD, upcoming appointments, and meds. Pt verbalizes understanding and has no questions or concerns at this time. Pt denies headache, blurred vision, and right epigastric pain. Pt discharged from hospital in stable condition.

## 2019-06-25 NOTE — Progress Notes (Signed)
Subjective: HD #3 readmitted with postpartum preeclampsia. Postpartum Day 12: Cesarean Delivery  Patient reports tolerating PO, + flatus and no problems voiding.headache visual disturbances and sob have resolved completely. She was having numbness in arms and face yesterday however this resolved. She is ambulating without difficulty and tolerating po     Objective: Vital signs in last 24 hours: Temp:  [98.3 F (36.8 C)-99.1 F (37.3 C)] 98.5 F (36.9 C) (09/27 0800) Pulse Rate:  [97-125] 97 (09/27 0800) Resp:  [18-21] 18 (09/27 0800) BP: (113-150)/(62-93) 136/81 (09/27 0800) SpO2:  [94 %-99 %] 94 % (09/27 0800) Weight:  [150.1 kg] 150.1 kg (09/27 0618)  Physical Exam:  General: alert, cooperative and no distress  Lungs Clear bilaterally no rales rhonchi or wheezes  Lochia: appropriate Uterine Fundus: firm Incision: healing well DVT Evaluation: No evidence of DVT seen on physical exam. Decreased edema currently 1+...   Recent Labs    06/23/19 0643 06/24/19 0525  HGB 9.2* 9.5*  HCT 29.3* 30.4*    Assessment/Plan: HD #3 postpartum preeclampsia. Pt improved s/p 24 hours magnesium sulfate.  Elevated bp - well controlled with procardia XL 30 mg daily. Continue this outpatient. Pt to come to the office in 2 days for bp check.  D/C home today   Christophe Louis 06/25/2019, 11:02 AM

## 2019-06-25 NOTE — Discharge Summary (Signed)
Physician Discharge Summary  Patient ID: Emily Goodman MRN: [id number] DOB/AGE: December 05, 1995 22 y.o.  Admit date: 06/22/2019 Discharge date: 06/25/2019  Admission Diagnoses: postpartum preeclampsia   Discharge Diagnoses:  Active Problems:   Preeclampsia   Discharged Condition: stable  Hospital Course: pt was admitted on 06/22/2019 with postpartum preeclampsia she had severe range bp upon presentation to [facility] she was started on magnesium sulfate for 24 hours of therapy. Once this was discontinued she was started on procardia XL 30 mg daily for control of hypertension. She did well with resolution of SOB/ visual disturbances and headache.   Consults: None  Significant Diagnostic Studies: labs:  Results for orders placed or performed during the hospital encounter of 06/22/19 (from the past 48 hour(s))  CBC     Status: Abnormal   Collection Time: 06/24/19  5:25 AM  Result Value Ref Range   WBC 8.2 4.0 - 10.5 K/uL   RBC 3.63 (L) 3.87 - 5.11 MIL/uL   Hemoglobin 9.5 (L) 12.0 - 15.0 g/dL   HCT 24.2 (L) 68.3 - 41.9 %   MCV 83.7 80.0 - 100.0 fL   MCH 26.2 26.0 - 34.0 pg   MCHC 31.3 30.0 - 36.0 g/dL   RDW 62.2 (H) 29.7 - 98.9 %   Platelets 497 (H) 150 - 400 K/uL   nRBC 0.0 0.0 - 0.2 %    Comment: Performed at [facility], [address], [place], [place] [address]  Comprehensive metabolic panel     Status: Abnormal   Collection Time: 06/24/19  5:25 AM  Result Value Ref Range   Sodium 139 135 - 145 mmol/L   Potassium 3.8 3.5 - 5.1 mmol/L   Chloride 104 98 - 111 mmol/L   CO2 25 22 - 32 mmol/L   Glucose, Bld 91 70 - 99 mg/dL   BUN 8 6 - 20 mg/dL   Creatinine, Ser 1.74 0.44 - 1.00 mg/dL   Calcium 7.7 (L) 8.9 - 10.3 mg/dL   Total Protein 6.4 (L) 6.5 - 8.1 g/dL   Albumin 2.5 (L) 3.5 - 5.0 g/dL   AST 31 15 - 41 U/L   ALT 22 0 - 44 U/L   Alkaline Phosphatase 78 38 - 126 U/L   Total Bilirubin 0.5 0.3 - 1.2 mg/dL   GFR calc non Af Amer >60 >60 mL/min   GFR calc Af  Amer >60 >60 mL/min   Anion gap 10 5 - 15    Comment: Performed at [facility], [address], [place], [place] [address]    Treatments: magnesium sulfate iv x 24 hours / antihypertensisves     Disposition: Discharge disposition: 01-Home or Self Care       Discharge Instructions    Activity as tolerated   Complete by: As directed    Call MD for:  difficulty breathing, headache or visual disturbances   Complete by: As directed    Call MD for:  persistant nausea and vomiting   Complete by: As directed    Call MD for:  redness, tenderness, or signs of infection (pain, swelling, redness, odor or green/yellow discharge around incision site)   Complete by: As directed    Call MD for:  severe uncontrolled pain   Complete by: As directed    Call MD for:  temperature >100.4   Complete by: As directed    Diet - low sodium heart healthy   Complete by: As directed    Driving restriction  Complete by: As directed    Avoid driving for at least 2 days   Lifting restrictions   Complete by: As directed    Weight restriction of 10 lbs.   No wound care   Complete by: As directed    Sexual acrtivity   Complete by: As directed    Avoid sex for 4 more weeks for total of 6 weeeks     Allergies as of 06/25/2019   No Known Allergies     Medication List    TAKE these medications   acetaminophen 500 MG tablet Commonly known as: TYLENOL Take 500 mg by mouth every 6 (six) hours as needed for mild pain or headache.   cetirizine 10 MG tablet Commonly known as: ZYRTEC Take 10 mg by mouth daily as needed for allergies.   ibuprofen 800 MG tablet Commonly known as: ADVIL Take 1 tablet (800 mg total) by mouth every 8 (eight) hours.   NIFEdipine 30 MG 24 hr tablet Commonly known as: ADALAT CC Take 1 tablet (30 mg total) by mouth daily. Start taking on: June 26, 2019   oxyCODONE 5 MG immediate release tablet Commonly known as: Oxy IR/ROXICODONE Take 1-2 tablets (5-10 mg  total) by mouth every 4 (four) hours as needed for moderate pain or breakthrough pain.   prenatal multivitamin Tabs tablet Take 1 tablet by mouth daily at 12 noon.      Follow-up Information    Christophe Louis, MD. Go to.   Specialty: Obstetrics and Gynecology Why: postpartum visit for incision and blood pressure check  Contact information: [address] [place] [address] [phone]           Signed: Christophe Louis 06/25/2019, 11:09 AM

## 2019-08-04 DIAGNOSIS — Z98891 History of uterine scar from previous surgery: Secondary | ICD-10-CM | POA: Diagnosis not present

## 2019-09-04 DIAGNOSIS — Z3202 Encounter for pregnancy test, result negative: Secondary | ICD-10-CM | POA: Diagnosis not present

## 2019-09-04 DIAGNOSIS — Z30430 Encounter for insertion of intrauterine contraceptive device: Secondary | ICD-10-CM | POA: Diagnosis not present

## 2019-10-13 ENCOUNTER — Encounter (HOSPITAL_COMMUNITY): Payer: Self-pay | Admitting: Obstetrics and Gynecology

## 2019-10-16 DIAGNOSIS — L659 Nonscarring hair loss, unspecified: Secondary | ICD-10-CM | POA: Diagnosis not present

## 2019-10-16 DIAGNOSIS — Z30432 Encounter for removal of intrauterine contraceptive device: Secondary | ICD-10-CM | POA: Diagnosis not present

## 2020-04-22 ENCOUNTER — Other Ambulatory Visit: Payer: Self-pay | Admitting: Surgical Oncology

## 2020-04-22 DIAGNOSIS — R109 Unspecified abdominal pain: Secondary | ICD-10-CM

## 2020-04-29 ENCOUNTER — Encounter: Payer: Self-pay | Admitting: Radiology

## 2020-04-29 ENCOUNTER — Ambulatory Visit
Admission: RE | Admit: 2020-04-29 | Discharge: 2020-04-29 | Disposition: A | Discharge status: Home / Self Care | Payer: Medicaid Other | Source: Ambulatory Visit | Attending: Surgical Oncology | Admitting: Surgical Oncology

## 2020-04-29 DIAGNOSIS — R109 Unspecified abdominal pain: Secondary | ICD-10-CM

## 2020-10-14 IMAGING — CR DG CHEST 2V
2 series · 2 of 2 positions shown · non-contrast
Comparison: November 20, 2015

CLINICAL DATA: 22-year-old female with a history of blurred vision

EXAM:
CHEST - 2 VIEW

[w chest pa]
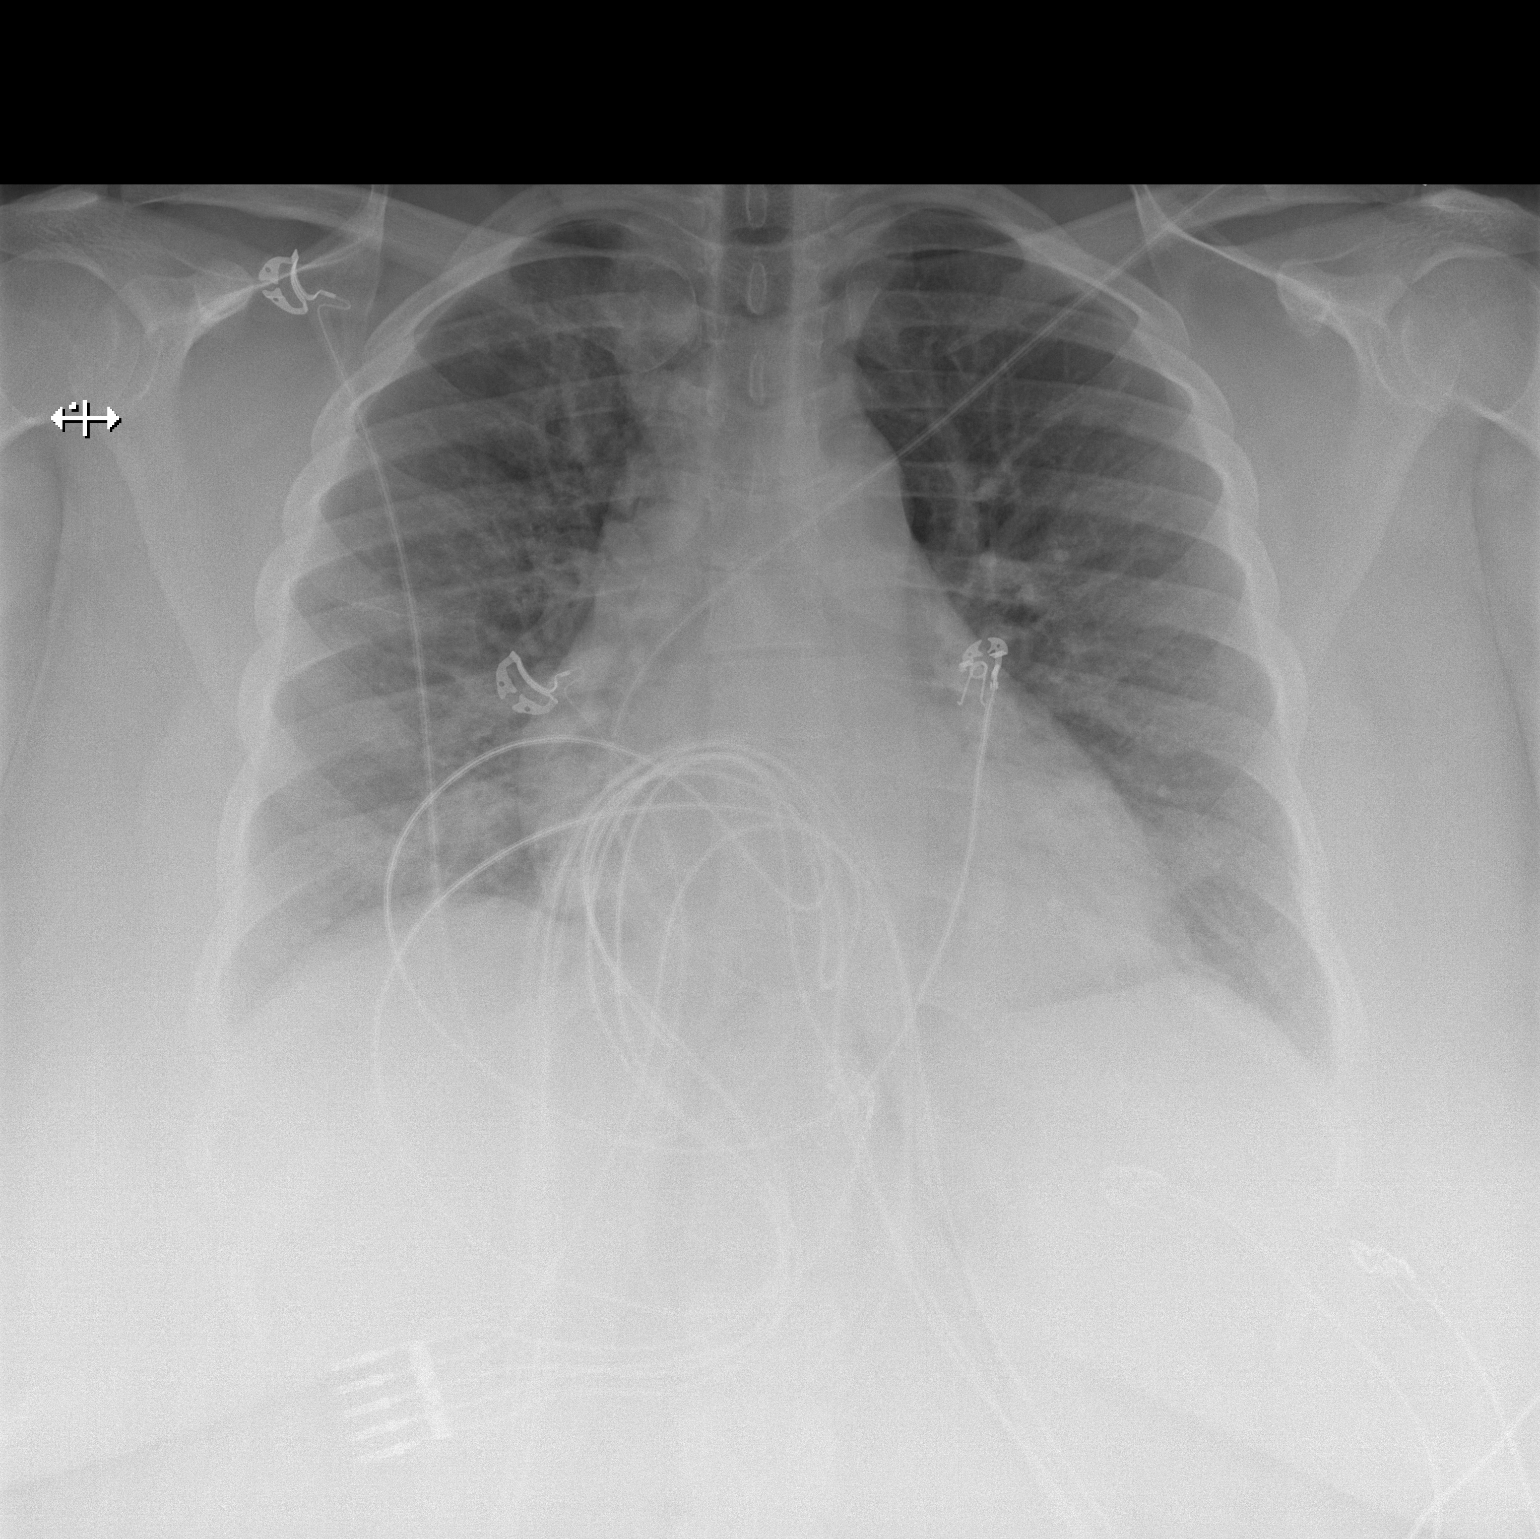

[w chest lat]
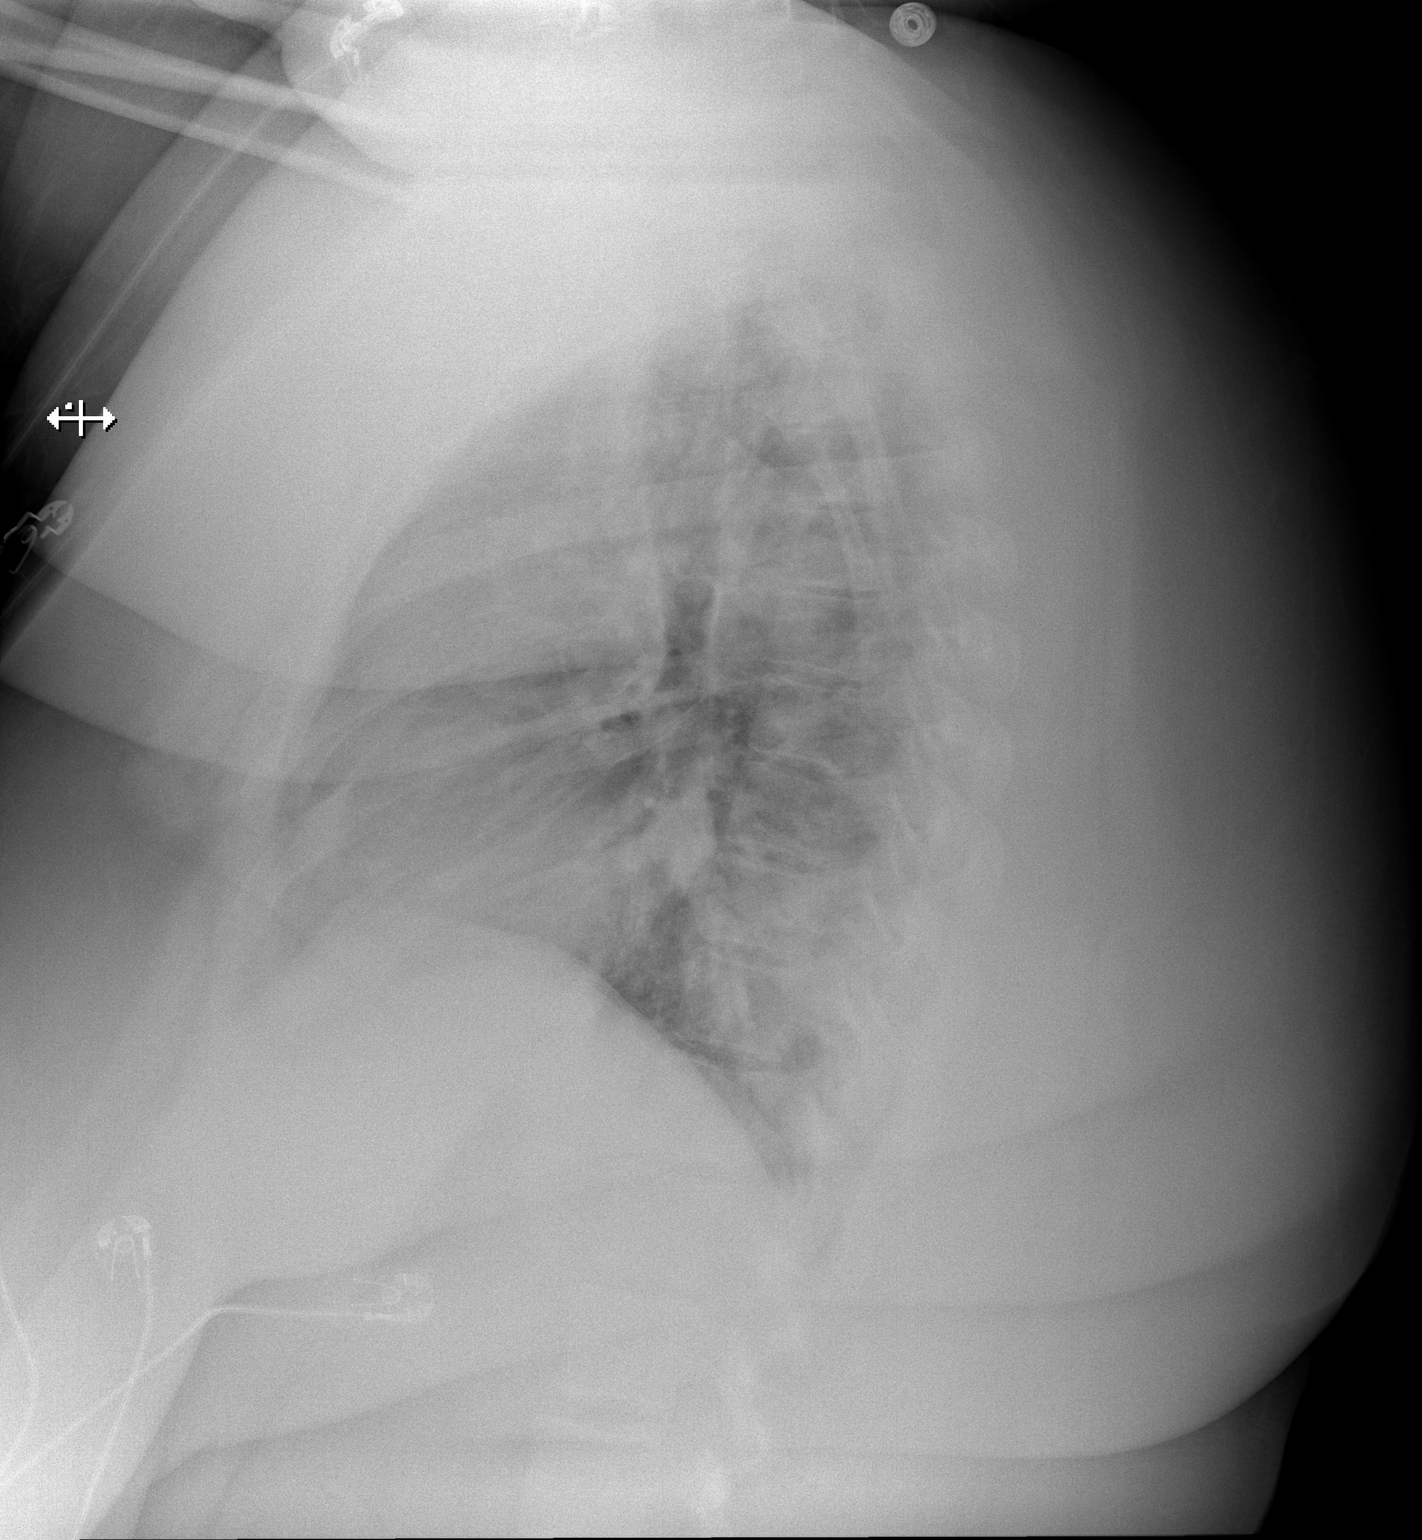

[2 of 2 positions shown; findings below may reference images not displayed]

FINDINGS: Cardiomediastinal silhouette unchanged in size and contour. Low lung
volumes. Coarsened interstitial markings bilaterally. No large
pleural effusion. No pneumothorax. No confluent airspace disease.
IMPRESSION: Low lung volumes with coarsened interstitial markings, potentially
atelectasis, early edema, or atypical infection.

## 2020-11-26 ENCOUNTER — Ambulatory Visit (HOSPITAL_COMMUNITY)
Admission: EM | Admit: 2020-11-26 | Discharge: 2020-11-26 | Disposition: A | Discharge status: Home / Self Care | Payer: Medicaid Other | Attending: Student | Admitting: Student

## 2020-11-26 ENCOUNTER — Encounter (HOSPITAL_COMMUNITY): Payer: Self-pay | Admitting: Emergency Medicine

## 2020-11-26 ENCOUNTER — Other Ambulatory Visit: Payer: Self-pay

## 2020-11-26 DIAGNOSIS — Z112 Encounter for screening for other bacterial diseases: Secondary | ICD-10-CM

## 2020-11-26 DIAGNOSIS — Z3202 Encounter for pregnancy test, result negative: Secondary | ICD-10-CM

## 2020-11-26 DIAGNOSIS — Z1152 Encounter for screening for COVID-19: Secondary | ICD-10-CM | POA: Diagnosis not present

## 2020-11-26 DIAGNOSIS — J029 Acute pharyngitis, unspecified: Secondary | ICD-10-CM

## 2020-11-26 DIAGNOSIS — Z20822 Contact with and (suspected) exposure to covid-19: Secondary | ICD-10-CM | POA: Diagnosis not present

## 2020-11-26 DIAGNOSIS — Z975 Presence of (intrauterine) contraceptive device: Secondary | ICD-10-CM | POA: Diagnosis present

## 2020-11-26 LAB — POCT INFECTIOUS MONO SCREEN, ED / UC: Mono Screen: NEGATIVE

## 2020-11-26 LAB — SARS CORONAVIRUS 2 (TAT 6-24 HRS): SARS Coronavirus 2: NEGATIVE

## 2020-11-26 LAB — POC URINE PREG, ED: Preg Test, Ur: NEGATIVE

## 2020-11-26 LAB — POCT RAPID STREP A, ED / UC: Streptococcus, Group A Screen (Direct): NEGATIVE

## 2020-11-26 MED ORDER — PREDNISONE 20 MG PO TABS: 20.0000 mg | ORAL_TABLET | Freq: Every day | ORAL | 0 refills | Status: AC

## 2020-11-26 NOTE — ED Triage Notes (Signed)
Started feeling bad 1-2 days ago.  Patient complains of swollen throat and white patches.  Denies fever.

## 2020-11-26 NOTE — ED Provider Notes (Signed)
[facility]    CSN: [id number] Arrival date & time: 11/26/20  1006      History   Chief Complaint Chief Complaint  Patient presents with  . Sore Throat    HPI Emily Goodman is a 25 y.o. female presenting with sore throat with white patches for 2 days.  History anemia, migraines, term pregnancy.  States that her throat is sore and she noticed white patches on her tonsils 2 days ago.  Also endorses fatigue.  States the lymph nodes in her neck are sore. Denies fevers/chills, n/v/d, shortness of breath, chest pain, cough, congestion, facial pain, teeth pain, headaches, sore throat, loss of taste/smell,  ear pain.   IUD for contraception. She doesn't get periods due to this. Requesting pregnancy test.   HPI  Past Medical History:  Diagnosis Date  . Anemia   . Migraines     Patient Active Problem List   Diagnosis Date Noted  . Preeclampsia 06/23/2019  . Term pregnancy 06/12/2019    Past Surgical History:  Procedure Laterality Date  . CESAREAN SECTION N/A 06/13/2019   Procedure: CESAREAN SECTION;  Surgeon: Gerald Leitz, MD;  [facility]S;  Service: Obstetrics;  Laterality: N/A;  . NO PAST SURGERIES      OB History    Gravida  1   Para  1   Term  1   Preterm  0   AB  0   Living  1     SAB  0   IAB  0   Ectopic  0   Multiple  0   Live Births  1            Home Medications    Prior to Admission medications   Medication Sig Start Date End Date Taking? Authorizing Provider  AMOXICILLIN PO Take by mouth.   Yes [provider]  predniSONE (DELTASONE) 20 MG tablet Take 1 tablet (20 mg total) by mouth daily for 5 days. 11/26/20 12/01/20 Yes Rhys Martini, PA-C  acetaminophen (TYLENOL) 500 MG tablet Take 500 mg by mouth every 6 (six) hours as needed for mild pain or headache.    [provider]  cetirizine (ZYRTEC) 10 MG tablet Take 10 mg by mouth daily as needed for allergies.    [provider]  ibuprofen (ADVIL)  800 MG tablet Take 1 tablet (800 mg total) by mouth every 8 (eight) hours. 06/16/19   Geryl Rankins, MD  NIFEdipine (ADALAT CC) 30 MG 24 hr tablet Take 1 tablet (30 mg total) by mouth daily. 06/26/19   Gerald Leitz, MD  oxyCODONE (OXY IR/ROXICODONE) 5 MG immediate release tablet Take 1-2 tablets (5-10 mg total) by mouth every 4 (four) hours as needed for moderate pain or breakthrough pain. 06/16/19   Geryl Rankins, MD  Prenatal Vit-Fe Fumarate-FA (PRENATAL MULTIVITAMIN) TABS tablet Take 1 tablet by mouth daily at 12 noon.    [provider]    Family History Family History  Problem Relation Age of Onset  . Heart attack Father   . Hypertension Father   . Kidney disease Father   . Stroke Maternal Grandmother   . Diabetes Maternal Grandfather     Social History Social History   Tobacco Use  . Smoking status: Never Smoker  . Smokeless tobacco: Never Used  Vaping Use  . Vaping Use: Never used  Substance Use Topics  . Alcohol use: Not Currently  . Drug use: Not Currently    Types: Marijuana  Allergies   Patient has no known allergies.   Review of Systems Review of Systems  Constitutional: Positive for fatigue. Negative for appetite change, chills and fever.  HENT: Positive for sore throat. Negative for congestion, ear pain, rhinorrhea, sinus pressure and sinus pain.   Eyes: Negative for redness and visual disturbance.  Respiratory: Negative for cough, chest tightness, shortness of breath and wheezing.   Cardiovascular: Negative for chest pain and palpitations.  Gastrointestinal: Negative for abdominal pain, constipation, diarrhea, nausea and vomiting.  Genitourinary: Negative for dysuria, frequency and urgency.  Musculoskeletal: Negative for myalgias.  Neurological: Negative for dizziness, weakness and headaches.  Psychiatric/Behavioral: Negative for confusion.  All other systems reviewed and are negative.    Physical Exam Triage Vital Signs ED Triage Vitals   Enc Vitals Group     BP 11/26/20 1127 111/81     Pulse Rate 11/26/20 1127 94     Resp 11/26/20 1127 20     Temp 11/26/20 1127 98.5 F (36.9 C)     Temp Source 11/26/20 1127 Oral     SpO2 11/26/20 1127 100 %     Weight --      Height --      Head Circumference --      Peak Flow --      Pain Score 11/26/20 1122 3     Pain Loc --      Pain Edu? --      Excl. in GC? --    No data found.  Updated Vital Signs BP 111/81 (BP Location: Left Arm) Comment (BP Location): regular cuff on forearm  Pulse 94   Temp 98.5 F (36.9 C) (Oral)   Resp 20   SpO2 100%   Visual Acuity Right Eye Distance:   Left Eye Distance:   Bilateral Distance:    Right Eye Near:   Left Eye Near:    Bilateral Near:     Physical Exam Vitals reviewed.  Constitutional:      General: She is not in acute distress.    Appearance: Normal appearance. She is obese. She is not ill-appearing.  HENT:     Head: Normocephalic and atraumatic.     Right Ear: Hearing, tympanic membrane, ear canal and external ear normal. No swelling or tenderness. There is no impacted cerumen. No mastoid tenderness. Tympanic membrane is not perforated, erythematous, retracted or bulging.     Left Ear: Hearing, tympanic membrane, ear canal and external ear normal. No swelling or tenderness. There is no impacted cerumen. No mastoid tenderness. Tympanic membrane is not perforated, erythematous, retracted or bulging.     Nose:     Right Sinus: No maxillary sinus tenderness or frontal sinus tenderness.     Left Sinus: No maxillary sinus tenderness or frontal sinus tenderness.     Mouth/Throat:     Mouth: Mucous membranes are moist.     Pharynx: Uvula midline. Posterior oropharyngeal erythema present. No oropharyngeal exudate.     Tonsils: Tonsillar exudate present. 2+ on the right. 2+ on the left.     Comments: Smooth erythema posterior pharynx Cardiovascular:     Rate and Rhythm: Normal rate and regular rhythm.     Heart sounds: Normal  heart sounds.  Pulmonary:     Breath sounds: Normal breath sounds and air entry. No wheezing, rhonchi or rales.  Chest:     Chest wall: No tenderness.  Abdominal:     General: Abdomen is flat. Bowel sounds are normal.  Tenderness: There is no abdominal tenderness. There is no guarding or rebound.  Lymphadenopathy:     Cervical: Cervical adenopathy present.     Right cervical: Deep cervical adenopathy present.     Left cervical: Deep cervical adenopathy present.  Neurological:     General: No focal deficit present.     Mental Status: She is alert and oriented to person, place, and time.  Psychiatric:        Attention and Perception: Attention and perception normal.        Mood and Affect: Mood and affect normal.        Behavior: Behavior normal. Behavior is cooperative.        Thought Content: Thought content normal.        Judgment: Judgment normal.      UC Treatments / Results  Labs (all labs ordered are listed, but only abnormal results are displayed) Labs Reviewed  SARS CORONAVIRUS 2 (TAT 6-24 HRS)  CULTURE, GROUP A STREP [facility])  POCT RAPID STREP A, ED / UC  POC URINE PREG, ED  POCT INFECTIOUS MONO SCREEN, ED / UC    EKG   Radiology No results found.  Procedures Procedures (including critical care time)  Medications Ordered in UC Medications - No data to display  Initial Impression / Assessment and Plan / UC Course  I have reviewed the triage vital signs and the nursing notes.  Pertinent labs & imaging results that were available during my care of the patient were reviewed by me and considered in my medical decision making (see chart for details).      This patient is a 25 year old female presenting with sore throat with tonsillar exudate for 2 days.  IUD for contraception, patient is requesting pregnancy test today. Negative.  Rapid strep negative. Culture sent.  Rapid mono negative. Urine pregnancy negative. Covid sent. Isolation as per CDC  guidelines.   Plan to treat with prednisone for tonsillar inflammation.  She is not a diabetic.  Return precautions discussed.  This chart was dictated using voice recognition software, Dragon. Despite the best efforts of this provider to proofread and correct errors, errors may still occur which can change documentation meaning.   Final Clinical Impressions(s) / UC Diagnoses   Final diagnoses:  Viral pharyngitis  Screening for streptococcal infection  Encounter for screening for COVID-19  Negative pregnancy test  IUD contraception     Discharge Instructions     -Today your strep test, mono test, and pregnancy test were all negative. -We are still sending a strep culture, and if this comes back as positive, we will call you in 1 to 2 days and will send  treatment at that time. -We also collected a Covid test today.  This should come back tomorrow.  This will go straight to your email in [place], and we will also call you if this is positive. -I sent a prescription for prednisone to help with the swelling and inflammation of your tonsils.  This is a steroid, take 1 pill of this in the morning daily for 5 days in a row.  This can give energy, so take in the morning -If you develop new symptoms like shortness of breath, fever/chills, cough, chest pain-seek immediate medical attention.    ED Prescriptions    Medication Sig Dispense Auth. Provider   predniSONE (DELTASONE) 20 MG tablet Take 1 tablet (20 mg total) by mouth daily for 5 days. 5 tablet Samuella Cota     PDMP  not reviewed this encounter.   Rhys Martini, PA-C 11/26/20 1236

## 2020-11-26 NOTE — Discharge Instructions (Addendum)
-  Today your strep test, mono test, and pregnancy test were all negative. -We are still sending a strep culture, and if this comes back as positive, we will call you in 1 to 2 days and will send  treatment at that time. -We also collected a Covid test today.  This should come back tomorrow.  This will go straight to your email in [place], and we will also call you if this is positive. -I sent a prescription for prednisone to help with the swelling and inflammation of your tonsils.  This is a steroid, take 1 pill of this in the morning daily for 5 days in a row.  This can give energy, so take in the morning -If you develop new symptoms like shortness of breath, fever/chills, cough, chest pain-seek immediate medical attention.

## 2020-11-29 LAB — CULTURE, GROUP A STREP (THRC)

## 2021-06-09 ENCOUNTER — Encounter (HOSPITAL_COMMUNITY): Payer: Self-pay | Admitting: *Deleted

## 2021-06-09 ENCOUNTER — Ambulatory Visit (HOSPITAL_COMMUNITY)
Admission: EM | Admit: 2021-06-09 | Discharge: 2021-06-09 | Disposition: A | Discharge status: Home / Self Care | Payer: Medicaid Other | Attending: Emergency Medicine | Admitting: Emergency Medicine

## 2021-06-09 ENCOUNTER — Other Ambulatory Visit: Payer: Self-pay

## 2021-06-09 DIAGNOSIS — U071 COVID-19: Secondary | ICD-10-CM | POA: Diagnosis not present

## 2021-06-09 DIAGNOSIS — J069 Acute upper respiratory infection, unspecified: Secondary | ICD-10-CM

## 2021-06-09 LAB — POC INFLUENZA A AND B ANTIGEN (URGENT CARE ONLY)
INFLUENZA A ANTIGEN, POC: NEGATIVE
INFLUENZA B ANTIGEN, POC: NEGATIVE

## 2021-06-09 LAB — SARS CORONAVIRUS 2 (TAT 6-24 HRS): SARS Coronavirus 2: POSITIVE — AB

## 2021-06-09 NOTE — ED Provider Notes (Signed)
[facility]    CSN: [id number] Arrival date & time: 06/09/21  7846      History   Chief Complaint Chief Complaint  Patient presents with   Sore Throat   Generalized Body Aches   Chills   Cough    HPI Emily Goodman is a 25 y.o. female.   Patient here for evaluation of chills, cough, sore throat, and body aches that have been ongoing since Thursday.  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  Reports taking OTC medication with minimal symptom relief.  Reports that she has been unable to check her temperature at home but has had chills and diaphoresis.  Denies any trauma, injury, or other precipitating event.  Denies any specific alleviating or aggravating factors.  Denies any chest pain, shortness of breath, N/V/D, numbness, tingling, weakness, abdominal pain, or headaches.    The history is provided by the patient.  Sore Throat  Cough Associated symptoms: chills, diaphoresis, myalgias and sore throat    Past Medical History:  Diagnosis Date   Anemia    Migraines     Patient Active Problem List   Diagnosis Date Noted   Preeclampsia 06/23/2019   Term pregnancy 06/12/2019    Past Surgical History:  Procedure Laterality Date   CESAREAN SECTION N/A 06/13/2019   Procedure: CESAREAN SECTION;  Surgeon: Gerald Leitz, MD;  Location: [facility] LD [facility]S;  Service: Obstetrics;  Laterality: N/A;   NO PAST SURGERIES      OB History     Gravida  1   Para  1   Term  1   Preterm  0   AB  0   Living  1      SAB  0   IAB  0   Ectopic  0   Multiple  0   Live Births  1            Home Medications    Prior to Admission medications   Medication Sig Start Date End Date Taking? Authorizing Provider  acetaminophen (TYLENOL) 500 MG tablet Take 500 mg by mouth every 6 (six) hours as needed for mild pain or headache.    [provider]  AMOXICILLIN PO Take by mouth.     [provider]  cetirizine (ZYRTEC) 10 MG tablet Take 10 mg by mouth daily as needed for allergies.    [provider]  ibuprofen (ADVIL) 800 MG tablet Take 1 tablet (800 mg total) by mouth every 8 (eight) hours. 06/16/19   Geryl Rankins, MD  NIFEdipine (ADALAT CC) 30 MG 24 hr tablet Take 1 tablet (30 mg total) by mouth daily. 06/26/19   Gerald Leitz, MD  oxyCODONE (OXY IR/ROXICODONE) 5 MG immediate release tablet Take 1-2 tablets (5-10 mg total) by mouth every 4 (four) hours as needed for moderate pain or breakthrough pain. 06/16/19   Geryl Rankins, MD  Prenatal Vit-Fe Fumarate-FA (PRENATAL MULTIVITAMIN) TABS tablet Take 1 tablet by mouth daily at 12 noon.    [provider]    Family History Family History  Problem Relation Age of Onset   Heart attack Father    Hypertension Father    Kidney disease Father    Stroke Maternal Grandmother    Diabetes Maternal Grandfather     Social History Social History   Tobacco Use   Smoking status: Never   Smokeless tobacco: Never  Vaping Use  Vaping Use: Never used  Substance Use Topics   Alcohol use: Not Currently   Drug use: Not Currently    Types: Marijuana     Allergies   Patient has no known allergies.   Review of Systems Review of Systems  Constitutional:  Positive for chills and diaphoresis.  HENT:  Positive for congestion and sore throat.   Respiratory:  Positive for cough.   Musculoskeletal:  Positive for myalgias.  All other systems reviewed and are negative.   Physical Exam Triage Vital Signs ED Triage Vitals  Enc Vitals Group     BP 06/09/21 0934 123/82     Pulse Rate 06/09/21 0934 (!) 103     Resp --      Temp 06/09/21 0934 99.4 F (37.4 C)     Temp Source 06/09/21 0934 Oral     SpO2 06/09/21 0934 96 %     Weight --      Height --      Head Circumference --      Peak Flow --      Pain Score 06/09/21 0941 5     Pain Loc --      Pain Edu? --      Excl. in GC? --    No data  found.  Updated Vital Signs BP 123/82 (BP Location: Left Wrist)   Pulse (!) 103   Temp 99.4 F (37.4 C) (Oral)   LMP  (Approximate) Comment: irreg due to IUD  SpO2 96%   Visual Acuity Right Eye Distance:   Left Eye Distance:   Bilateral Distance:    Right Eye Near:   Left Eye Near:    Bilateral Near:     Physical Exam Vitals and nursing note reviewed.  Constitutional:      General: She is not in acute distress.    Appearance: Normal appearance. She is not ill-appearing, toxic-appearing or diaphoretic.  HENT:     Head: Normocephalic and atraumatic.     Nose: Congestion and rhinorrhea present.     Mouth/Throat:     Pharynx: Uvula midline. Posterior oropharyngeal erythema present. No pharyngeal swelling or oropharyngeal exudate.     Tonsils: No tonsillar exudate or tonsillar abscesses. 0 on the right. 0 on the left.  Eyes:     Conjunctiva/sclera: Conjunctivae normal.  Cardiovascular:     Rate and Rhythm: Normal rate and regular rhythm.     Pulses: Normal pulses.     Heart sounds: Normal heart sounds.  Pulmonary:     Effort: Pulmonary effort is normal.     Breath sounds: Normal breath sounds.  Abdominal:     General: Abdomen is flat.  Musculoskeletal:        General: Normal range of motion.     Cervical back: Normal range of motion.  Skin:    General: Skin is warm and dry.  Neurological:     General: No focal deficit present.     Mental Status: She is alert and oriented to person, place, and time.  Psychiatric:        Mood and Affect: Mood normal.     UC Treatments / Results  Labs (all labs ordered are listed, but only abnormal results are displayed) Labs Reviewed  SARS CORONAVIRUS 2 (TAT 6-24 HRS)  POC INFLUENZA A AND B ANTIGEN (URGENT CARE ONLY)    EKG   Radiology No results found.  Procedures Procedures (including critical care time)  Medications Ordered in UC Medications - No data to  display  Initial Impression / Assessment and Plan / UC  Course  I have reviewed the triage vital signs and the nursing notes.  Pertinent labs & imaging results that were available during my care of the patient were reviewed by me and considered in my medical decision making (see chart for details).    Assessment negative for red flags or concerns.  Likely a viral illness.  COVID test and flu swab pending.  Discussed current CDC guidelines for quarantine and work note given to patient.  Discussed conservative symptom management as described in discharge instructions.  Encourage fluids and rest.  Tylenol and/or ibuprofen as needed.  Follow-up as needed Final Clinical Impressions(s) / UC Diagnoses   Final diagnoses:  Viral URI     Discharge Instructions      We will contact you if your COVID test or flu test is positive.  Please quarantine while you wait for the results.  If your COVID test is negative you may resume normal activities.  If your COVID test is positive please continue to quarantine for at least 5 days from your symptom onset or until you are without a fever for at least 24 hours after the medications.  You can take Tylenol and/or Ibuprofen as needed for fever reduction and pain relief.   For cough: honey 1/2 to 1 teaspoon (you can dilute the honey in water or another fluid).  You can also use guaifenesin and dextromethorphan for cough. You can use a humidifier for chest congestion and cough.  If you don't have a humidifier, you can sit in the bathroom with the hot shower running.     For sore throat: try warm salt water gargles, cepacol lozenges, throat spray, warm tea or water with lemon/honey, popsicles or ice, or OTC cold relief medicine for throat discomfort.    For congestion: take a daily anti-histamine like Zyrtec, Claritin, and a oral decongestant, such as pseudoephedrine.  You can also use Flonase 1-2 sprays in each nostril daily.    It is important to stay hydrated: drink plenty of fluids (water,  gatorade/powerade/pedialyte, juices, or teas) to keep your throat moisturized and help further relieve irritation/discomfort.   Return or go to the Emergency Department if symptoms worsen or do not improve in the next few days.      ED Prescriptions   None    PDMP not reviewed this encounter.   Ivette Loyal, NP 06/09/21 1001

## 2021-06-09 NOTE — ED Triage Notes (Signed)
Rts Sx's started on Thursday  Body aches ,chills,cough,sore throat.

## 2021-06-09 NOTE — Discharge Instructions (Signed)
We will contact you if your COVID test or flu test is positive.  Please quarantine while you wait for the results.  If your COVID test is negative you may resume normal activities.  If your COVID test is positive please continue to quarantine for at least 5 days from your symptom onset or until you are without a fever for at least 24 hours after the medications.  You can take Tylenol and/or Ibuprofen as needed for fever reduction and pain relief.   For cough: honey 1/2 to 1 teaspoon (you can dilute the honey in water or another fluid).  You can also use guaifenesin and dextromethorphan for cough. You can use a humidifier for chest congestion and cough.  If you don't have a humidifier, you can sit in the bathroom with the hot shower running.     For sore throat: try warm salt water gargles, cepacol lozenges, throat spray, warm tea or water with lemon/honey, popsicles or ice, or OTC cold relief medicine for throat discomfort.    For congestion: take a daily anti-histamine like Zyrtec, Claritin, and a oral decongestant, such as pseudoephedrine.  You can also use Flonase 1-2 sprays in each nostril daily.    It is important to stay hydrated: drink plenty of fluids (water, gatorade/powerade/pedialyte, juices, or teas) to keep your throat moisturized and help further relieve irritation/discomfort.   Return or go to the Emergency Department if symptoms worsen or do not improve in the next few days.

## 2021-08-06 ENCOUNTER — Other Ambulatory Visit: Payer: Self-pay

## 2021-08-06 ENCOUNTER — Emergency Department (HOSPITAL_BASED_OUTPATIENT_CLINIC_OR_DEPARTMENT_OTHER)
Admission: EM | Admit: 2021-08-06 | Discharge: 2021-08-06 | Disposition: A | Discharge status: Home / Self Care | Payer: Medicaid Other | Attending: Emergency Medicine | Admitting: Emergency Medicine

## 2021-08-06 ENCOUNTER — Encounter (HOSPITAL_BASED_OUTPATIENT_CLINIC_OR_DEPARTMENT_OTHER): Payer: Self-pay

## 2021-08-06 DIAGNOSIS — R519 Headache, unspecified: Secondary | ICD-10-CM | POA: Insufficient documentation

## 2021-08-06 DIAGNOSIS — Z20822 Contact with and (suspected) exposure to covid-19: Secondary | ICD-10-CM | POA: Insufficient documentation

## 2021-08-06 DIAGNOSIS — M791 Myalgia, unspecified site: Secondary | ICD-10-CM | POA: Diagnosis not present

## 2021-08-06 DIAGNOSIS — R0981 Nasal congestion: Secondary | ICD-10-CM | POA: Diagnosis not present

## 2021-08-06 DIAGNOSIS — J029 Acute pharyngitis, unspecified: Secondary | ICD-10-CM | POA: Insufficient documentation

## 2021-08-06 DIAGNOSIS — R059 Cough, unspecified: Secondary | ICD-10-CM | POA: Diagnosis present

## 2021-08-06 DIAGNOSIS — R000 Tachycardia, unspecified: Secondary | ICD-10-CM | POA: Diagnosis not present

## 2021-08-06 DIAGNOSIS — Z8616 Personal history of COVID-19: Secondary | ICD-10-CM | POA: Diagnosis not present

## 2021-08-06 LAB — RESP PANEL BY RT-PCR (FLU A&B, COVID) ARPGX2
Influenza A by PCR: NEGATIVE
Influenza B by PCR: NEGATIVE
SARS Coronavirus 2 by RT PCR: NEGATIVE

## 2021-08-06 LAB — GROUP A STREP BY PCR: Group A Strep by PCR: NOT DETECTED

## 2021-08-06 MED ORDER — LIDOCAINE VISCOUS HCL 2 % MT SOLN
15.0000 mL | Freq: Once | OROMUCOSAL | Status: AC
  Administered 2021-08-06: 13:00:00 15 mL via OROMUCOSAL
  Filled 2021-08-06: qty 15

## 2021-08-06 MED ORDER — LIDOCAINE VISCOUS HCL 2 % MT SOLN: 15.0000 mL | OROMUCOSAL | 0 refills | Status: DC | PRN

## 2021-08-06 NOTE — Discharge Instructions (Signed)
Your strep test was negative, I do suspect that you have a viral sore throat.  You can try using viscous lidocaine to help with swallowing.  I also recommend using cough drops and warm tea.  Return to work when you are feeling better, I suspect that will be by Monday.  Return to ED if things fail to improve or worsen.

## 2021-08-06 NOTE — ED Triage Notes (Signed)
Pt c/o flu like sx x 4 days-NAD-steady gait 

## 2021-08-06 NOTE — ED Provider Notes (Signed)
[facility] EMERGENCY DEPARTMENT Provider Note   CSN: [id number] Arrival date & time: 08/06/21  1204     History Chief Complaint  Patient presents with   Cough    Emily Goodman is a 25 y.o. female.   Cough Associated symptoms: headaches and sore throat   Associated symptoms: no chest pain, no fever, no myalgias and no shortness of breath    Patient presents with cough.  It started acutely 4 days ago, its been constant since then.  There is associated sore throat, body chills, headache.  Denies any diarrhea, vomiting, chest pain, shortness of breath.  Had COVID in September, states this feels different.  Her son was at home sick with similar symptoms the last week.  Patient is tried DayQuil and NyQuil which is helped with her symptoms but made her feel sleepy.  Past Medical History:  Diagnosis Date   Anemia    Migraines     Patient Active Problem List   Diagnosis Date Noted   Preeclampsia 06/23/2019   Term pregnancy 06/12/2019    Past Surgical History:  Procedure Laterality Date   CESAREAN SECTION N/A 06/13/2019   Procedure: CESAREAN SECTION;  Surgeon: Gerald Leitz, MD;  Location: [facility] LD ORS;  Service: Obstetrics;  Laterality: N/A;   NO PAST SURGERIES       OB History     Gravida  1   Para  1   Term  1   Preterm  0   AB  0   Living  1      SAB  0   IAB  0   Ectopic  0   Multiple  0   Live Births  1           Family History  Problem Relation Age of Onset   Heart attack Father    Hypertension Father    Kidney disease Father    Stroke Maternal Grandmother    Diabetes Maternal Grandfather     Social History   Tobacco Use   Smoking status: Never   Smokeless tobacco: Never  Vaping Use   Vaping Use: Some days  Substance Use Topics   Alcohol use: Not Currently   Drug use: Yes    Types: Marijuana    Home Medications Prior to Admission medications   Medication Sig Start Date End Date Taking? Authorizing Provider   acetaminophen (TYLENOL) 500 MG tablet Take 500 mg by mouth every 6 (six) hours as needed for mild pain or headache.    [provider]  AMOXICILLIN PO Take by mouth.    [provider]  cetirizine (ZYRTEC) 10 MG tablet Take 10 mg by mouth daily as needed for allergies.    [provider]  ibuprofen (ADVIL) 800 MG tablet Take 1 tablet (800 mg total) by mouth every 8 (eight) hours. 06/16/19   Geryl Rankins, MD  NIFEdipine (ADALAT CC) 30 MG 24 hr tablet Take 1 tablet (30 mg total) by mouth daily. 06/26/19   Gerald Leitz, MD  oxyCODONE (OXY IR/ROXICODONE) 5 MG immediate release tablet Take 1-2 tablets (5-10 mg total) by mouth every 4 (four) hours as needed for moderate pain or breakthrough pain. 06/16/19   Geryl Rankins, MD  Prenatal Vit-Fe Fumarate-FA (PRENATAL MULTIVITAMIN) TABS tablet Take 1 tablet by mouth daily at 12 noon.    [provider]    Allergies    Patient has no known allergies.  Review of Systems   Review of Systems  Constitutional:  Negative for fever.  HENT:  Positive for congestion and sore throat.   Respiratory:  Positive for cough. Negative for shortness of breath.   Cardiovascular:  Negative for chest pain.  Gastrointestinal:  Negative for nausea and vomiting.  Musculoskeletal:  Negative for myalgias.  Neurological:  Positive for headaches.   Physical Exam Updated Vital Signs BP (!) 119/55 (BP Location: Left Arm)   Pulse (!) 104   Temp 99.3 F (37.4 C) (Oral)   Resp 20   Ht 5\' 3"  (1.6 m)   Wt (!) 144.7 kg   SpO2 99%   BMI 56.51 kg/m   Physical Exam Vitals and nursing note reviewed. Exam conducted with a chaperone present.  Constitutional:      Appearance: Normal appearance.  HENT:     Head: Normocephalic and atraumatic.     Nose: Congestion present.     Mouth/Throat:     Pharynx: Posterior oropharyngeal erythema present.  Eyes:     General: No scleral icterus.       Right eye: No discharge.        Left eye: No  discharge.     Extraocular Movements: Extraocular movements intact.     Pupils: Pupils are equal, round, and reactive to light.  Cardiovascular:     Rate and Rhythm: Regular rhythm. Tachycardia present.     Pulses: Normal pulses.     Heart sounds: Normal heart sounds. No murmur heard.   No friction rub. No gallop.     Comments: Mildly tachycardic at 104, rate is regular and no murmurs or rubs. Pulmonary:     Effort: Pulmonary effort is normal. No respiratory distress.     Breath sounds: Normal breath sounds.     Comments: Lungs CTA bilaterally. No accessory muscle use. Speaking in complete sentences.  Abdominal:     General: Abdomen is flat. Bowel sounds are normal. There is no distension.     Palpations: Abdomen is soft.     Tenderness: There is no abdominal tenderness.  Musculoskeletal:     Cervical back: Normal range of motion.  Skin:    General: Skin is warm and dry.     Coloration: Skin is not jaundiced.  Neurological:     Mental Status: She is alert. Mental status is at baseline.     Coordination: Coordination normal.  Psychiatric:        Mood and Affect: Mood normal.    ED Results / Procedures / Treatments   Labs (all labs ordered are listed, but only abnormal results are displayed) Labs Reviewed  GROUP A STREP BY PCR  RESP PANEL BY RT-PCR (FLU A&B, COVID) [id number]    EKG None  Radiology No results found.  Procedures Procedures   Medications Ordered in ED Medications - No data to display  ED Course  I have reviewed the triage vital signs and the nursing notes.  Pertinent labs & imaging results that were available during my care of the patient were reviewed by me and considered in my medical decision making (see chart for details).    MDM Rules/Calculators/A&P                           Elevated temperature but not febrile, slightly tachycardic at 104.  No chest pain or shortness of breath, doubt PE.  Patient does have viral symptoms, high suspicion for  URI.  Will also test for strep.  Strep negative, flu negative, COVID-negative.  Will discharge  with symptomatic management and supportive care.  Do not suspect any emergent pathology, patient discharged in stable position with return precautions.  Final Clinical Impression(s) / ED Diagnoses Final diagnoses:  None    Rx / DC Orders ED Discharge Orders     None        Theron Arista, [place] 08/06/21 1313    Milagros Loll, MD 08/11/21 [phone]

## 2021-08-11 ENCOUNTER — Ambulatory Visit (HOSPITAL_BASED_OUTPATIENT_CLINIC_OR_DEPARTMENT_OTHER)
Admission: RE | Admit: 2021-08-11 | Discharge: 2021-08-11 | Disposition: A | Discharge status: Home / Self Care | Payer: Medicaid Other | Source: Ambulatory Visit | Attending: Emergency Medicine | Admitting: Emergency Medicine

## 2021-08-11 ENCOUNTER — Other Ambulatory Visit: Payer: Self-pay

## 2021-08-11 ENCOUNTER — Ambulatory Visit
Admission: EM | Admit: 2021-08-11 | Discharge: 2021-08-11 | Disposition: A | Discharge status: Home / Self Care | Payer: Medicaid Other | Attending: Emergency Medicine | Admitting: Emergency Medicine

## 2021-08-11 DIAGNOSIS — R062 Wheezing: Secondary | ICD-10-CM

## 2021-08-11 DIAGNOSIS — J069 Acute upper respiratory infection, unspecified: Secondary | ICD-10-CM

## 2021-08-11 DIAGNOSIS — H66001 Acute suppurative otitis media without spontaneous rupture of ear drum, right ear: Secondary | ICD-10-CM

## 2021-08-11 DIAGNOSIS — R590 Localized enlarged lymph nodes: Secondary | ICD-10-CM

## 2021-08-11 DIAGNOSIS — R0989 Other specified symptoms and signs involving the circulatory and respiratory systems: Secondary | ICD-10-CM

## 2021-08-11 DIAGNOSIS — J189 Pneumonia, unspecified organism: Secondary | ICD-10-CM

## 2021-08-11 DIAGNOSIS — J029 Acute pharyngitis, unspecified: Secondary | ICD-10-CM

## 2021-08-11 MED ORDER — AZITHROMYCIN 250 MG PO TABS: 250.0000 mg | ORAL_TABLET | Freq: Every day | ORAL | 0 refills | Status: AC

## 2021-08-11 MED ORDER — FLUCONAZOLE 150 MG PO TABS: ORAL_TABLET | ORAL | 0 refills | Status: AC

## 2021-08-11 MED ORDER — AMOXICILLIN-POT CLAVULANATE 875-125 MG PO TABS: 1.0000 | ORAL_TABLET | Freq: Two times a day (BID) | ORAL | 0 refills | Status: AC

## 2021-08-11 NOTE — ED Triage Notes (Signed)
Pt report having flu like symptoms (congestion, cough, sore throat, chills, and body aches). Started: about a week ago

## 2021-08-11 NOTE — Discharge Instructions (Addendum)
Please go to the [facility] med center to have your x-ray done today.  Please begin 2 antibiotics for community acquired pneumonia today.  I have prescribed Augmentin 1 tablet twice daily for 10 days and azithromycin 2 tablets today then 1 tablet daily until complete.  Because antibiotics typically give women vaginal yeast infections, I have also provided you with a prescription for 2 tablets of Diflucan, first tablet to be taken on day 5 of antibiotics and the second tablet to be taken 3 days later.  If you have not had improvement of your symptoms in the next 3 to 5 days, please return for repeat evaluation.  If you have worsening symptoms despite antibiotic therapy, please go to the emergency room for more emergent evaluation.

## 2021-08-11 NOTE — ED Provider Notes (Signed)
[facility]    CSN: [id number] Arrival date & time: 08/11/21  1000   History   Chief Complaint Chief Complaint  Patient presents with   Cough   Nasal Congestion   HPI Emily Goodman is a 25 y.o. female. Patient states that for the past week she has been having flulike symptoms including congestion, cough, sore throat, chills and body aches.  Patient denies a history of asthma but endorses seasonal allergies.  Patient states she is taken Zyrtec in the past but is not currently taking it now, states that she usually has aware seasonal allergies this time of year.  Patient states she has been coughing a lot, feels like she has little congested in her chest but she cannot get it up and out.  Patient states she is also having intermittent episodes of feeling very hot then "sweating it out".  Patient is here with her boyfriend today.  Per boyfriend she has not been herself.  Patient denies nausea, vomiting, diarrhea.  Patient denies history of upper respiratory infection requiring hospitalization.  Patient states she does not smoke.  The history is provided by the patient.   Past Medical History:  Diagnosis Date   Anemia    Migraines    Patient Active Problem List   Diagnosis Date Noted   Preeclampsia 06/23/2019   Term pregnancy 06/12/2019   Past Surgical History:  Procedure Laterality Date   CESAREAN SECTION N/A 06/13/2019   Procedure: CESAREAN SECTION;  Surgeon: Gerald Leitz, MD;  Location: [facility] LD ORS;  Service: Obstetrics;  Laterality: N/A;   NO PAST SURGERIES     OB History     Gravida  1   Para  1   Term  1   Preterm  0   AB  0   Living  1      SAB  0   IAB  0   Ectopic  0   Multiple  0   Live Births  1          Home Medications    Prior to Admission medications   Medication Sig Start Date End Date Taking? Authorizing Provider  amoxicillin-clavulanate (AUGMENTIN) 875-125 MG tablet Take 1 tablet by mouth every 12 (twelve) hours for 10 days.  08/11/21 08/21/21 Yes Theadora Rama Scales, PA-C  azithromycin (ZITHROMAX) 250 MG tablet Take 1 tablet (250 mg total) by mouth daily. Take first 2 tablets together, then 1 every day until finished. 08/11/21  Yes Theadora Rama Scales, PA-C  fluconazole (DIFLUCAN) 150 MG tablet Take 1 tablet on day 5 of your antibiotics.  Take second tablet 3 days later. 08/11/21  Yes Theadora Rama Scales, PA-C  acetaminophen (TYLENOL) 500 MG tablet Take 500 mg by mouth every 6 (six) hours as needed for mild pain or headache.    [provider]  cetirizine (ZYRTEC) 10 MG tablet Take 10 mg by mouth daily as needed for allergies.    [provider]  ibuprofen (ADVIL) 800 MG tablet Take 1 tablet (800 mg total) by mouth every 8 (eight) hours. 06/16/19   Geryl Rankins, MD   Family History Family History  Problem Relation Age of Onset   Heart attack Father    Hypertension Father    Kidney disease Father    Stroke Maternal Grandmother    Diabetes Maternal Grandfather    Social History Social History   Tobacco Use   Smoking status: Never   Smokeless tobacco: Never  Vaping Use   Vaping Use:  Some days  Substance Use Topics   Alcohol use: Not Currently   Drug use: Yes    Types: Marijuana   Allergies   Patient has no known allergies.  Review of Systems Review of Systems Pertinent findings noted in history of present illness.   Physical Exam Triage Vital Signs ED Triage Vitals  Enc Vitals Group     BP 07/25/21 0827 (!) 147/82     Pulse Rate 07/25/21 0827 72     Resp 07/25/21 0827 18     Temp 07/25/21 0827 98.3 F (36.8 C)     Temp Source 07/25/21 0827 Oral     SpO2 07/25/21 0827 98 %     Weight --      Height --      Head Circumference --      Peak Flow --      Pain Score 07/25/21 0826 5     Pain Loc --      Pain Edu? --      Excl. in GC? --    No data found.  Updated Vital Signs BP 131/81 (BP Location: Left Arm)   Pulse 85   Temp 98.8 F (37.1 C) (Oral)   Resp  20   SpO2 96%   Visual Acuity Right Eye Distance:   Left Eye Distance:   Bilateral Distance:    Right Eye Near:   Left Eye Near:    Bilateral Near:     Physical Exam Vitals and nursing note reviewed.  Constitutional:      General: She is not in acute distress.    Appearance: Normal appearance. She is not ill-appearing.  HENT:     Head: Normocephalic and atraumatic.     Salivary Glands: Right salivary gland is not diffusely enlarged or tender. Left salivary gland is not diffusely enlarged or tender.     Right Ear: Ear canal and external ear normal. No drainage. A middle ear effusion is present. There is no impacted cerumen. Tympanic membrane is erythematous and bulging.     Left Ear: Tympanic membrane, ear canal and external ear normal. No drainage.  No middle ear effusion. There is no impacted cerumen. Tympanic membrane is not erythematous or bulging.     Nose: Nose normal. No nasal deformity, septal deviation, mucosal edema, congestion or rhinorrhea.     Right Turbinates: Enlarged, swollen and pale.     Left Turbinates: Enlarged, swollen and pale.     Right Sinus: No maxillary sinus tenderness or frontal sinus tenderness.     Left Sinus: No maxillary sinus tenderness or frontal sinus tenderness.     Mouth/Throat:     Lips: Pink. No lesions.     Mouth: Mucous membranes are moist. No oral lesions.     Pharynx: Oropharynx is clear. Uvula midline. No posterior oropharyngeal erythema or uvula swelling.     Tonsils: No tonsillar exudate. 0 on the right. 0 on the left.  Eyes:     General: Lids are normal.        Right eye: No discharge.        Left eye: No discharge.     Extraocular Movements: Extraocular movements intact.     Conjunctiva/sclera: Conjunctivae normal.     Right eye: Right conjunctiva is not injected.     Left eye: Left conjunctiva is not injected.  Neck:     Trachea: Trachea and phonation normal.  Cardiovascular:     Rate and Rhythm: Normal rate and regular  rhythm.  Pulses: Normal pulses.     Heart sounds: Normal heart sounds. No murmur heard.   No friction rub. No gallop.  Pulmonary:     Effort: Pulmonary effort is normal. No accessory muscle usage, prolonged expiration or respiratory distress.     Breath sounds: No stridor, decreased air movement or transmitted upper airway sounds. Examination of the right-upper field reveals rhonchi and rales. Examination of the right-middle field reveals rales. Rhonchi and rales present. No decreased breath sounds or wheezing.  Chest:     Chest wall: No tenderness.  Musculoskeletal:        General: Normal range of motion.     Cervical back: Normal range of motion and neck supple. Normal range of motion.  Lymphadenopathy:     Cervical: Cervical adenopathy present.     Right cervical: Deep cervical adenopathy and posterior cervical adenopathy present.     Left cervical: Deep cervical adenopathy and posterior cervical adenopathy present.  Skin:    General: Skin is warm and dry.     Findings: No erythema or rash.  Neurological:     General: No focal deficit present.     Mental Status: She is alert and oriented to person, place, and time.  Psychiatric:        Mood and Affect: Mood normal.        Behavior: Behavior normal.   UC Treatments / Results  Labs (all labs ordered are listed, but only abnormal results are displayed)  Labs Reviewed - No data to display  EKG  Radiology DG Chest 2 View  Result Date: 08/11/2021 CLINICAL DATA:  Abnormal breath sounds. EXAM: CHEST - 2 VIEW COMPARISON:  June 22, 2019. FINDINGS: The heart size and mediastinal contours are within normal limits. Both lungs are clear. The visualized skeletal structures are unremarkable. IMPRESSION: No active cardiopulmonary disease. Electronically Signed   By: Lupita Raider M.D.   On: 08/11/2021 12:42    Procedures Procedures (including critical care time)  Medications Ordered in UC Medications - No data to  display  Initial Impression / Assessment and Plan / UC Course  I have reviewed the triage vital signs and the nursing notes.  Pertinent labs & imaging results that were available during my care of the patient were reviewed by me and considered in my medical decision making (see chart for details).      Physical exam today did feel scattered rales throughout the right upper and mid lung fields along with some mild rhonchi.  Chest x-ray performed today was unremarkable.  Given physical exam findings, duration of symptoms and patient's reports of intermittent subjective fever and chills without significant history of lung disease, I recommend that patient be treated empirically for presumed community-acquired pneumonia secondary to recent viral upper respiratory infection.  Patient was provided with antibiotics along with a prophylactic dose of fluconazole given her reported history of vaginal yeast infections when taking antibiotics.  I have also recommended the patient resume taking Zyrtec and continue ibuprofen as needed for body aches and chills.  Return precautions advised.  Final Clinical Impressions(s) / UC Diagnoses   Final diagnoses:  Community acquired pneumonia, unspecified laterality  Acute suppurative otitis media of right ear  Acute pharyngitis, unspecified etiology  Acute upper respiratory infection  Cervical lymphadenopathy  Expiratory wheezing  Rales     Discharge Instructions      Please go to the [facility] med center to have your x-ray done today.  Please begin 2 antibiotics for community acquired  pneumonia today.  I have prescribed Augmentin 1 tablet twice daily for 10 days and azithromycin 2 tablets today then 1 tablet daily until complete.  Because antibiotics typically give women vaginal yeast infections, I have also provided you with a prescription for 2 tablets of Diflucan, first tablet to be taken on day 5 of antibiotics and the second tablet to be taken 3 days  later.  If you have not had improvement of your symptoms in the next 3 to 5 days, please return for repeat evaluation.  If you have worsening symptoms despite antibiotic therapy, please go to the emergency room for more emergent evaluation.     ED Prescriptions     Medication Sig Dispense Auth. Provider   amoxicillin-clavulanate (AUGMENTIN) 875-125 MG tablet Take 1 tablet by mouth every 12 (twelve) hours for 10 days. 20 tablet Theadora Rama Scales, PA-C   azithromycin (ZITHROMAX) 250 MG tablet Take 1 tablet (250 mg total) by mouth daily. Take first 2 tablets together, then 1 every day until finished. 6 tablet Theadora Rama Scales, PA-C   fluconazole (DIFLUCAN) 150 MG tablet Take 1 tablet on day 5 of your antibiotics.  Take second tablet 3 days later. 2 tablet Theadora Rama Scales, PA-C      PDMP not reviewed this encounter.  Disposition Upon Discharge:  Patient presented with an acute illness with associated systemic symptoms and significant discomfort requiring urgent management. In my opinion, this is a condition that a prudent lay person (someone who possesses an average knowledge of health and medicine) may potentially expect to result in complications if not addressed urgently such as respiratory distress, impairment of bodily function or dysfunction of bodily organs.   Routine symptom specific, illness specific and/or disease specific instructions were discussed with the patient and/or caregiver at length.   As such, the patient has been evaluated and assessed, work-up was performed and treatment was provided in alignment with urgent care protocols and evidence based medicine.  Patient/parent/caregiver has been advised that the patient may require follow up for further testing and treatment if the symptoms continue in spite of treatment, as clinically indicated and appropriate.  Patient/parent/caregiver has been advised to return to the [facility] or PCP in 3-5 days if no better; to PCP  or the Emergency Department if new signs and symptoms develop, or if the current signs or symptoms continue to change or worsen for further workup, evaluation and treatment as clinically indicated and appropriate  The patient will follow up with their current PCP if and as advised. If the patient does not currently have a PCP we will assist them in obtaining one.   Patient/parent/caregiver verbalized understanding and agreement of plan as discussed.  All questions were addressed during visit.  Please see discharge instructions below for further details of plan.  Condition: stable for discharge home Home: take medications as prescribed; routine discharge instructions as discussed; follow up as advised.    Theadora Rama Scales, [place] 08/12/21 [phone]

## 2021-08-20 ENCOUNTER — Ambulatory Visit
Admission: EM | Admit: 2021-08-20 | Discharge: 2021-08-20 | Disposition: A | Discharge status: Home / Self Care | Payer: Medicaid Other

## 2021-08-20 DIAGNOSIS — Z8701 Personal history of pneumonia (recurrent): Secondary | ICD-10-CM

## 2021-08-20 NOTE — ED Provider Notes (Signed)
[facility]    CSN: [id number] Arrival date & time: 08/20/21  1318    HISTORY  No chief complaint on file.  HPI Emily Goodman is a 25 y.o. female. Patient was evaluated and treated for pneumonia on August 11, 2021 at this clinic.  Patient returns for repeat evaluation so she can return to work, patient was initially written out of work until November 18 but her employer would not allow her to return to work without a note stating that she is fit to return to work.  Patient had to make an appointment to have this done today.  The history is provided by the patient.  Past Medical History:  Diagnosis Date   Anemia    Migraines    Patient Active Problem List   Diagnosis Date Noted   Preeclampsia 06/23/2019   Term pregnancy 06/12/2019   Past Surgical History:  Procedure Laterality Date   CESAREAN SECTION N/A 06/13/2019   Procedure: CESAREAN SECTION;  Surgeon: Gerald Leitz, MD;  Location: [facility];  Service: Obstetrics;  Laterality: N/A;   NO PAST SURGERIES     OB History     Gravida  1   Para  1   Term  1   Preterm  0   AB  0   Living  1      SAB  0   IAB  0   Ectopic  0   Multiple  0   Live Births  1          Home Medications    Prior to Admission medications   Medication Sig Start Date End Date Taking? Authorizing Provider  acetaminophen (TYLENOL) 500 MG tablet Take 500 mg by mouth every 6 (six) hours as needed for mild pain or headache.    [provider]  amoxicillin-clavulanate (AUGMENTIN) 875-125 MG tablet Take 1 tablet by mouth every 12 (twelve) hours for 10 days. 08/11/21 08/21/21  Theadora Rama Scales, PA-C  azithromycin (ZITHROMAX) 250 MG tablet Take 1 tablet (250 mg total) by mouth daily. Take first 2 tablets together, then 1 every day until finished. 08/11/21   Theadora Rama Scales, PA-C  cetirizine (ZYRTEC) 10 MG tablet Take 10 mg by mouth daily as needed for allergies.    [provider]  fluconazole  (DIFLUCAN) 150 MG tablet Take 1 tablet on day 5 of your antibiotics.  Take second tablet 3 days later. 08/11/21   Theadora Rama Scales, PA-C  ibuprofen (ADVIL) 800 MG tablet Take 1 tablet (800 mg total) by mouth every 8 (eight) hours. 06/16/19   Geryl Rankins, MD   Family History Family History  Problem Relation Age of Onset   Heart attack Father    Hypertension Father    Kidney disease Father    Stroke Maternal Grandmother    Diabetes Maternal Grandfather    Social History Social History   Tobacco Use   Smoking status: Never   Smokeless tobacco: Never  Vaping Use   Vaping Use: Some days  Substance Use Topics   Alcohol use: Not Currently   Drug use: Yes    Types: Marijuana   Allergies   Patient has no known allergies.  Review of Systems Review of Systems Pertinent findings noted in history of present illness.   Physical Exam Triage Vital Signs ED Triage Vitals  Enc Vitals Group     BP 07/25/21 0827 (!) 147/82     Pulse Rate 07/25/21 0827 72     Resp 07/25/21  0827 18     Temp 07/25/21 0827 98.3 F (36.8 C)     Temp Source 07/25/21 0827 Oral     SpO2 07/25/21 0827 98 %     Weight --      Height --      Head Circumference --      Peak Flow --      Pain Score 07/25/21 0826 5     Pain Loc --      Pain Edu? --      Excl. in GC? --   No data found.  Updated Vital Signs BP 133/81 (BP Location: Left Arm)   Pulse 88   Temp 98.1 F (36.7 C) (Oral)   Resp 20   SpO2 97%   Physical Exam Vitals and nursing note reviewed.  Constitutional:      General: She is not in acute distress.    Appearance: Normal appearance. She is not ill-appearing.  HENT:     Head: Normocephalic and atraumatic.     Salivary Glands: Right salivary gland is not diffusely enlarged or tender. Left salivary gland is not diffusely enlarged or tender.     Right Ear: Tympanic membrane, ear canal and external ear normal. No drainage. No middle ear effusion. There is no impacted cerumen.  Tympanic membrane is not erythematous or bulging.     Left Ear: Tympanic membrane, ear canal and external ear normal. No drainage.  No middle ear effusion. There is no impacted cerumen. Tympanic membrane is not erythematous or bulging.     Nose: Nose normal. No nasal deformity, septal deviation, mucosal edema, congestion or rhinorrhea.     Right Turbinates: Not enlarged, swollen or pale.     Left Turbinates: Not enlarged, swollen or pale.     Right Sinus: No maxillary sinus tenderness or frontal sinus tenderness.     Left Sinus: No maxillary sinus tenderness or frontal sinus tenderness.     Mouth/Throat:     Lips: Pink. No lesions.     Mouth: Mucous membranes are moist. No oral lesions.     Pharynx: Oropharynx is clear. Uvula midline. No posterior oropharyngeal erythema or uvula swelling.     Tonsils: No tonsillar exudate. 0 on the right. 0 on the left.  Eyes:     General: Lids are normal.        Right eye: No discharge.        Left eye: No discharge.     Extraocular Movements: Extraocular movements intact.     Conjunctiva/sclera: Conjunctivae normal.     Right eye: Right conjunctiva is not injected.     Left eye: Left conjunctiva is not injected.  Neck:     Trachea: Trachea and phonation normal.  Cardiovascular:     Rate and Rhythm: Normal rate and regular rhythm.     Pulses: Normal pulses.     Heart sounds: Normal heart sounds. No murmur heard.   No friction rub. No gallop.  Pulmonary:     Effort: Pulmonary effort is normal. No accessory muscle usage, prolonged expiration or respiratory distress.     Breath sounds: Normal breath sounds. No stridor, decreased air movement or transmitted upper airway sounds. No decreased breath sounds, wheezing, rhonchi or rales.  Chest:     Chest wall: No tenderness.  Abdominal:     General: Abdomen is flat. Bowel sounds are normal. There is no distension.     Palpations: Abdomen is soft.     Tenderness: There is no abdominal  tenderness. There is  no right CVA tenderness or left CVA tenderness.     Hernia: No hernia is present.  Musculoskeletal:        General: Normal range of motion.     Cervical back: Normal range of motion and neck supple. Normal range of motion.  Lymphadenopathy:     Cervical: No cervical adenopathy.  Skin:    General: Skin is warm and dry.     Findings: No erythema or rash.  Neurological:     General: No focal deficit present.     Mental Status: She is alert and oriented to person, place, and time.  Psychiatric:        Mood and Affect: Mood normal.        Behavior: Behavior normal.    Visual Acuity Right Eye Distance:   Left Eye Distance:   Bilateral Distance:    Right Eye Near:   Left Eye Near:    Bilateral Near:     UC Couse / Diagnostics / Procedures:    EKG  Radiology No results found.  Procedures Procedures (including critical care time)  UC Diagnoses / Final Clinical Impressions(s)    Final diagnoses:  History of pneumonia   I have reviewed the triage vital signs and the nursing notes.  Pertinent labs & imaging results that were available during my care of the patient were reviewed by me and considered in my medical decision making (see chart for details).    Patient was provided with form completion to return to work without restrictions.  Form was faxed to her employer.  Patient/parent/caregiver verbalized understanding and agreement of plan as discussed.  All questions were addressed during visit.  Please see discharge instructions below for further details of plan.  ED Prescriptions   None    PDMP not reviewed this encounter.  Pending results:  Labs Reviewed - No data to display   Medications Ordered in UC: Medications - No data to display  Discharge Instructions: Discharge Instructions   None     Disposition Upon Discharge:  Patient presented with an acute illness with associated systemic symptoms and significant discomfort requiring urgent management. In my  opinion, this is a condition that a prudent lay person (someone who possesses an average knowledge of health and medicine) may potentially expect to result in complications if not addressed urgently such as respiratory distress, impairment of bodily function or dysfunction of bodily organs.   Routine symptom specific, illness specific and/or disease specific instructions were discussed with the patient and/or caregiver at length.   As such, the patient has been evaluated and assessed, work-up was performed and treatment was provided in alignment with urgent care protocols and evidence based medicine.  Patient/parent/caregiver has been advised that the patient may require follow up for further testing and treatment if the symptoms continue in spite of treatment, as clinically indicated and appropriate.  If the patient was tested for COVID-19, Influenza and/or RSV, then the patient/parent/guardian was advised to isolate at home pending the results of his/her diagnostic coronavirus test and potentially longer if they're positive. I have also advised pt that if his/her COVID-19 test returns positive, it's recommended to self-isolate for at least 10 days after symptoms first appeared AND until fever-free for 24 hours without fever reducer AND other symptoms have improved or resolved. Discussed self-isolation recommendations as well as instructions for household member/close contacts as per the [facility] [place] DHHS, and also gave patient the COVID packet with this information.  Patient/parent/caregiver  has been advised to return to the [facility] or PCP in 3-5 days if no better; to PCP or the Emergency Department if new signs and symptoms develop, or if the current signs or symptoms continue to change or worsen for further workup, evaluation and treatment as clinically indicated and appropriate  The patient will follow up with their current PCP if and as advised. If the patient does not currently have a PCP we will assist  them in obtaining one.   The patient may need specialty follow up if the symptoms continue, in spite of conservative treatment and management, for further workup, evaluation, consultation and treatment as clinically indicated and appropriate.  Condition: stable for discharge home Home: take medications as prescribed; routine discharge instructions as discussed; follow up as advised.    Theadora Rama Scales, PA-C 08/20/21 1814

## 2021-08-20 NOTE — ED Triage Notes (Signed)
Patient in need of work note. Patient states she feels better since last visit.

## 2021-08-22 IMAGING — RF DG UGI W/ HIGH DENSITY W/O KUB
7 series · 14 of 24 positions shown · non-contrast
Comparison: None.

CLINICAL DATA: Abdominal pain. Preoperative evaluation for
bariatric surgery.

EXAM:
UPPER GI SERIES WITH KUB
TECHNIQUE: After obtaining a scout radiograph a routine upper GI series was
performed using thin and high density barium.
FLUOROSCOPY TIME:  Fluoroscopy Time:  2 minutes 12 seconds
Radiation Exposure Index (if provided by the fluoroscopic device):
906 mGy
Number of Acquired Spot Images: 9

[Series 1: one shot · 0.14mm/px · 4 of 8 slices shown (1 of 3)]
[im 1/8]
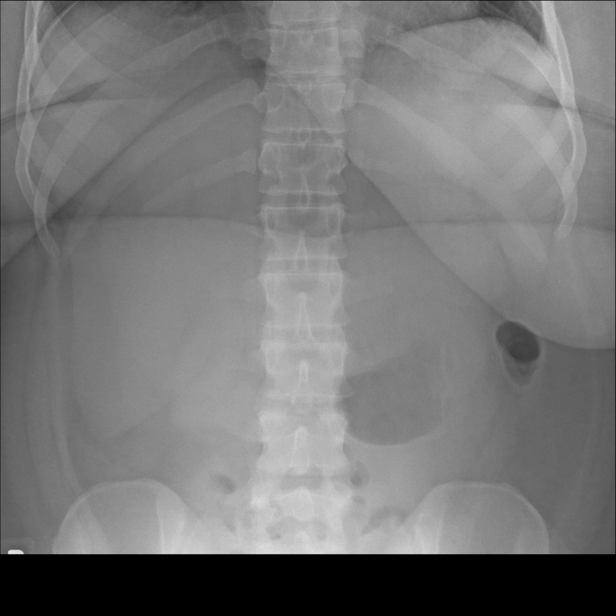
[im 3/8]
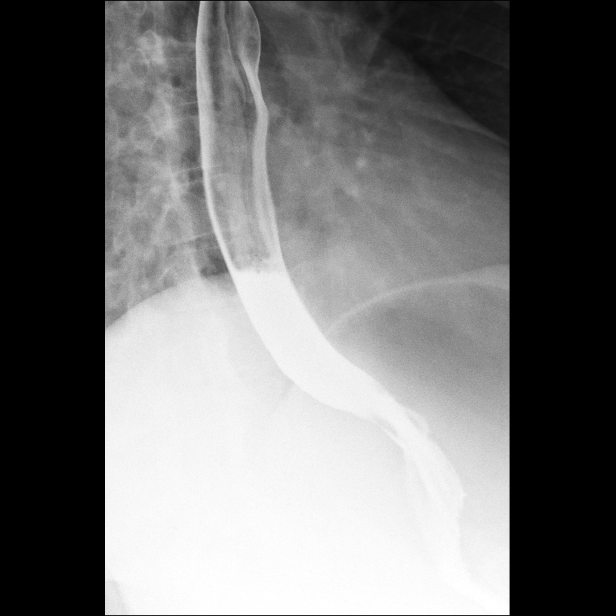
[im 6/8]
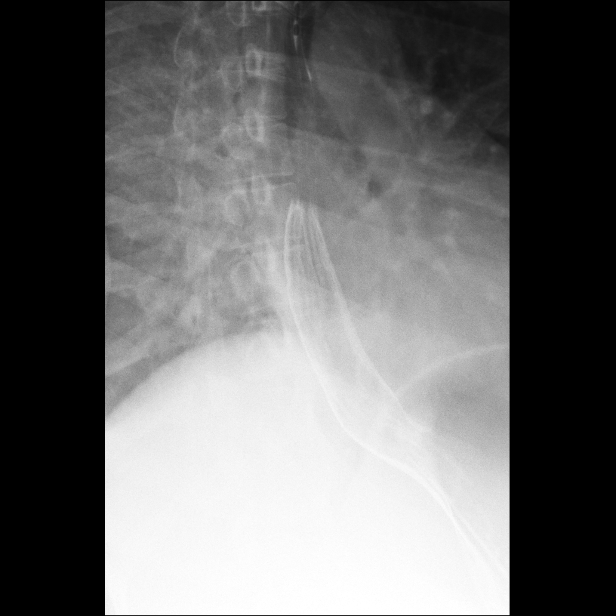
[im 8/8]
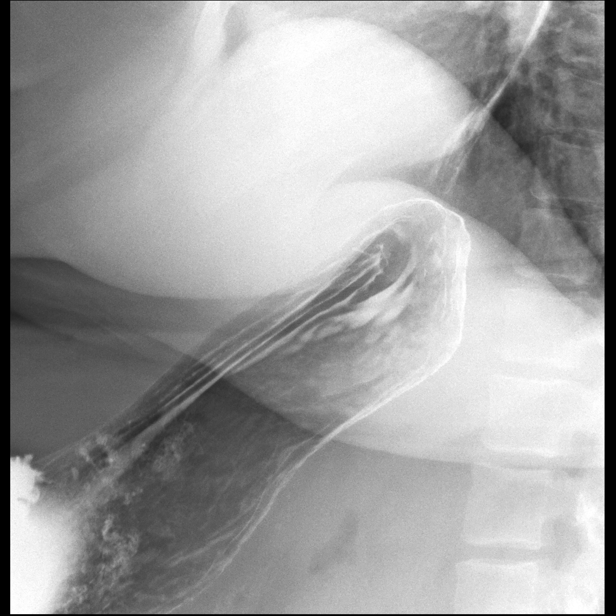

[Series 2: sequence · 2 of 34 frames shown (1 of 4)]
[frame 6/34]
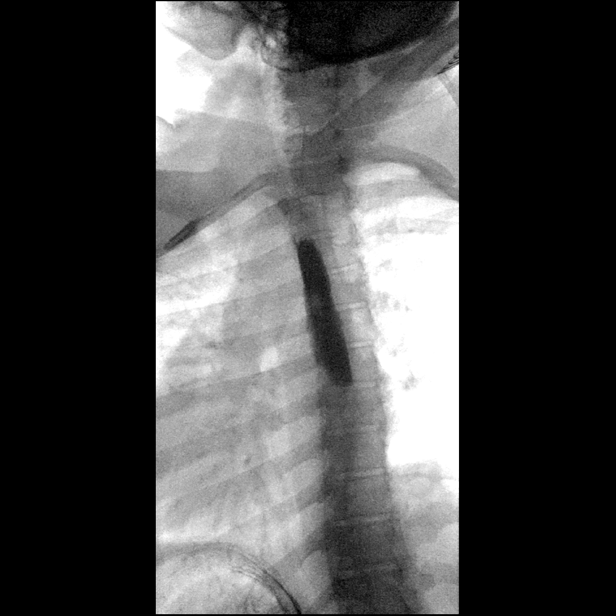
[frame 18/34]
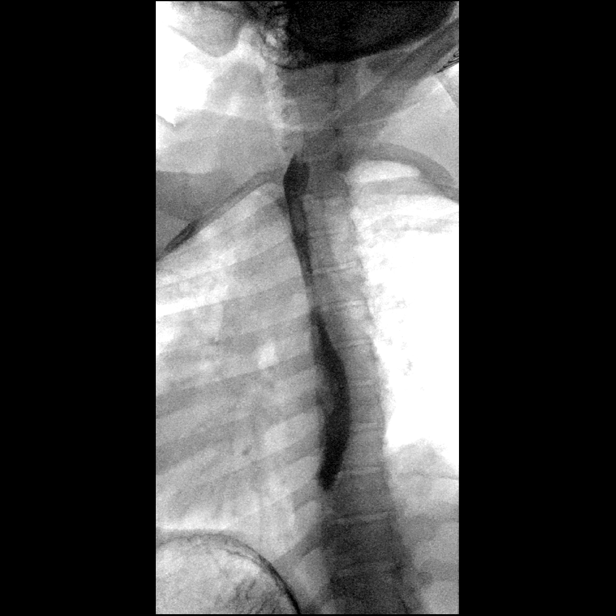

[Series 3: sequence · 2 of 101 frames shown (2 of 4)]
[frame 5/101]
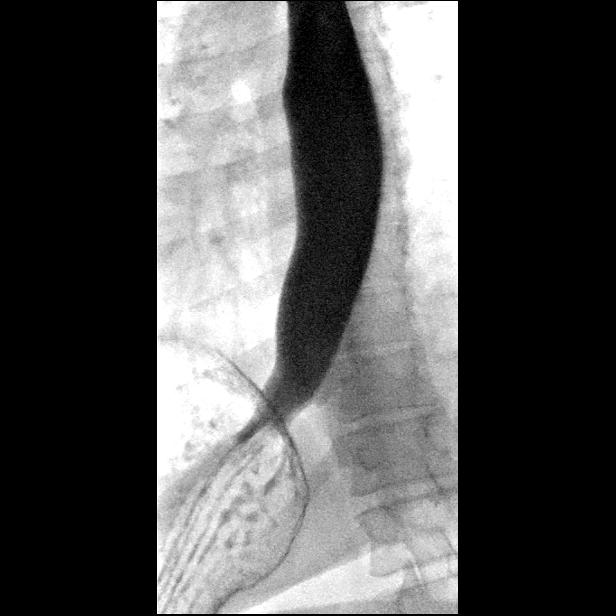
[frame 51/101]
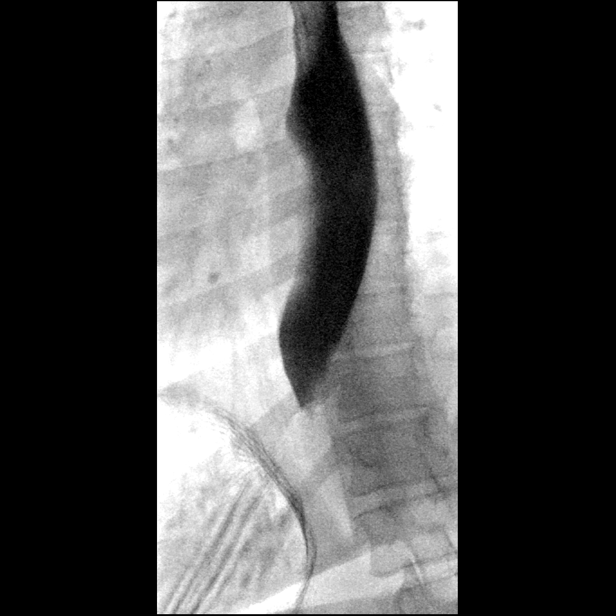

[Series 4: one shot · 0.16mm/px · 1 of 2 slices shown (2 of 3)]
[im 1/2]
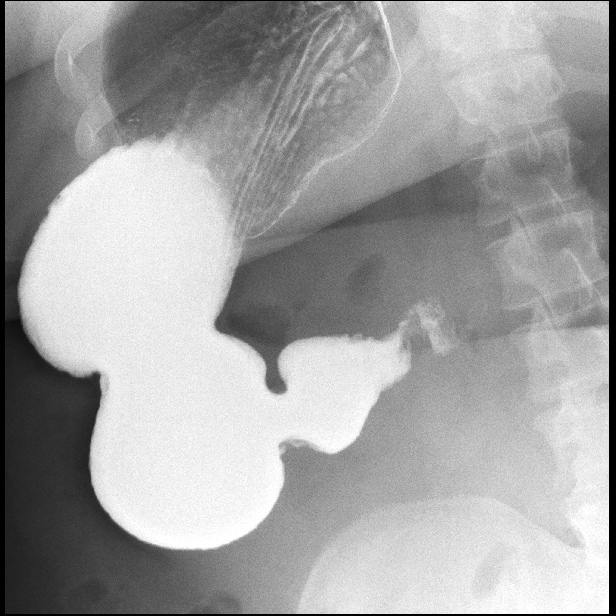

[Series 5: sequence · 3 of 16 frames shown (3 of 4)]
[frame 3/16]
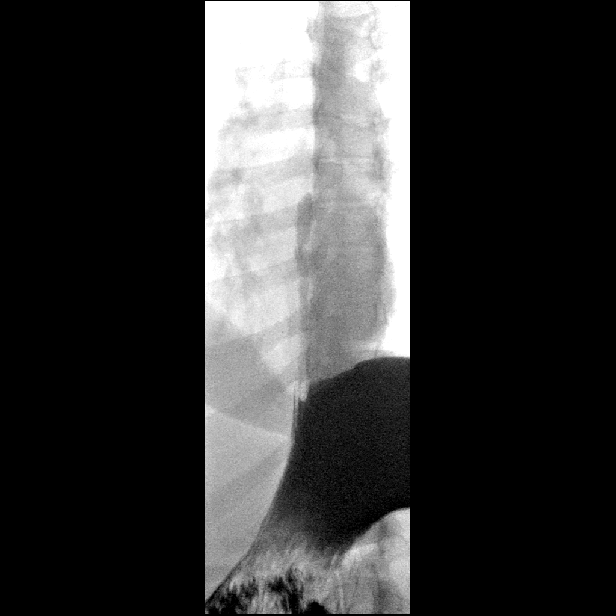
[frame 10/16]
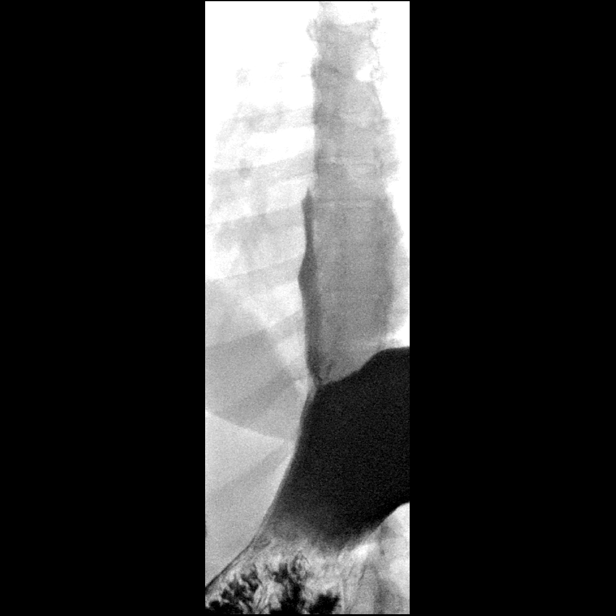
[frame 14/16]
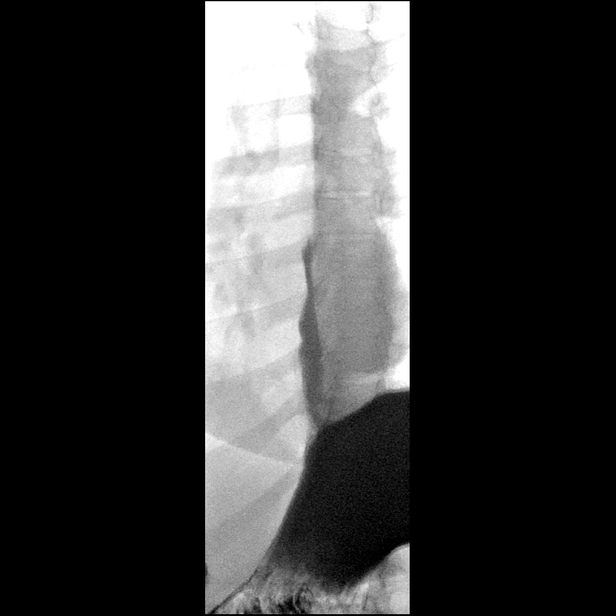

[Series 6: sequence · 1 of 31 frames shown (4 of 4)]
[frame 25/31]
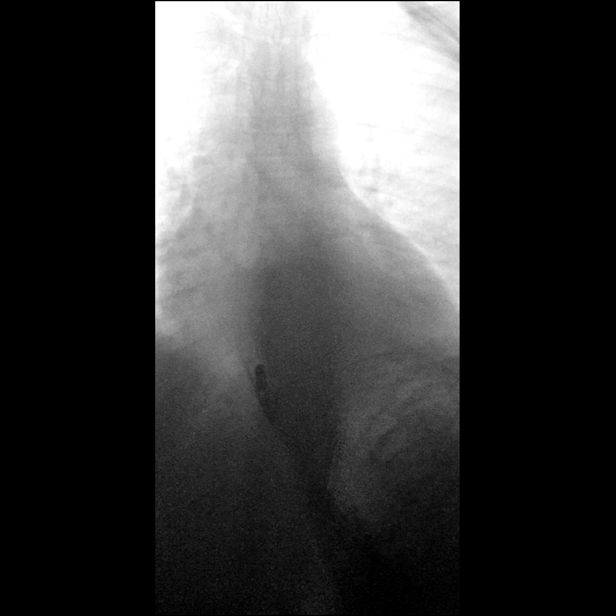

[Series 7: one shot · 1 of 1 slices shown (3 of 3)]
[im 1/1]
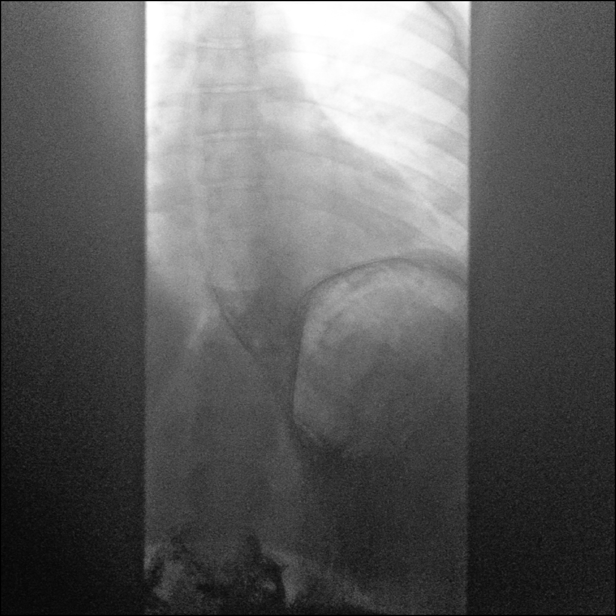

[14 of 24 positions shown; findings below may reference images not displayed]

FINDINGS: Scout radiograph demonstrates no dilated small bowel loops. No
evidence of pneumatosis or pneumoperitoneum. No radiopaque
nephrolithiasis. Clear lung bases.

No hiatal hernia. Mild gastroesophageal reflux elicited with water
siphon test to the level of the midthoracic esophagus. Mild
esophageal dysmotility, characterized by intermittent mild weakening
of primary peristalsis in the mid to lower thoracic esophagus.
Normal esophageal mucosa with no evidence of reflux esophagitis.
Normal esophageal distensibility, with no evidence esophageal mass,
stricture or ulcer. Barium tablet traversed the esophagus into the
stomach without delay.

No gastric fold thickening. No gastric filling defects, ulcers or
significant erosions. Normal gastric emptying. Normal duodenal bulb
and C sweep, with no duodenal fold thickening, filling defects,
strictures or ulcers. Normal duodenal jejunal junction to the left
of the spine. Visualized proximal jejunal loops is normal caliber
and without fold thickening.
IMPRESSION: 1. No hiatal hernia.  Mild gastroesophageal reflux elicited.
2. Mild esophageal dysmotility, characteristic of chronic reflux
related dysmotility.
3. Otherwise normal upper GI.

## 2021-12-30 ENCOUNTER — Other Ambulatory Visit: Payer: Self-pay

## 2021-12-30 ENCOUNTER — Encounter (HOSPITAL_BASED_OUTPATIENT_CLINIC_OR_DEPARTMENT_OTHER): Payer: Self-pay | Admitting: Emergency Medicine

## 2021-12-30 ENCOUNTER — Emergency Department (HOSPITAL_BASED_OUTPATIENT_CLINIC_OR_DEPARTMENT_OTHER)
Admission: EM | Admit: 2021-12-30 | Discharge: 2021-12-30 | Disposition: A | Discharge status: Home / Self Care | Payer: Medicaid Other | Attending: Emergency Medicine | Admitting: Emergency Medicine

## 2021-12-30 DIAGNOSIS — J029 Acute pharyngitis, unspecified: Secondary | ICD-10-CM

## 2021-12-30 DIAGNOSIS — B9789 Other viral agents as the cause of diseases classified elsewhere: Secondary | ICD-10-CM | POA: Insufficient documentation

## 2021-12-30 DIAGNOSIS — J069 Acute upper respiratory infection, unspecified: Secondary | ICD-10-CM | POA: Diagnosis not present

## 2021-12-30 DIAGNOSIS — J028 Acute pharyngitis due to other specified organisms: Secondary | ICD-10-CM | POA: Diagnosis not present

## 2021-12-30 LAB — GROUP A STREP BY PCR: Group A Strep by PCR: NOT DETECTED

## 2021-12-30 NOTE — ED Provider Notes (Addendum)
?[facility] EM[facility]ENC[facility] DEPARTMENT ?Provider Note ? ? ?CSN: [id number] ?Arrival date & time: 12/30/21  0086 ? ?  ? ?History ? ?Chief Complaint  ?Patient presents with  ? Sore Throat  ? ? ?Earnie Rockhold is a 26 y.o. female. ? ?Patient with a complaint of cough congestion and sore throat for 2 weeks.  Patient is concerned about having strep throat.  She has had it before.  Her son had a similar type illness that did go into the pneumonia was seen last week for that.  Past medical history significant for migraines and anemia.  Patient denies any fevers.  Any shortness of breath.  Patient also has right sided ear pain. ? ? ?  ? ?Home Medications ?Prior to Admission medications   ?Medication Sig Start Date End Date Taking? Authorizing Provider  ?acetaminophen (TYLENOL) 500 MG tablet Take 500 mg by mouth every 6 (six) hours as needed for mild pain or headache.    [provider]  ?azithromycin (ZITHROMAX) 250 MG tablet Take 1 tablet (250 mg total) by mouth daily. Take first 2 tablets together, then 1 every day until finished. 08/11/21   Theadora Rama Scales, PA-C  ?cetirizine (ZYRTEC) 10 MG tablet Take 10 mg by mouth daily as needed for allergies.    [provider]  ?fluconazole (DIFLUCAN) 150 MG tablet Take 1 tablet on day 5 of your antibiotics.  Take second tablet 3 days later. 08/11/21   Theadora Rama Scales, PA-C  ?ibuprofen (ADVIL) 800 MG tablet Take 1 tablet (800 mg total) by mouth every 8 (eight) hours. 06/16/19   Geryl Rankins, MD  ?   ? ?Allergies    ?Patient has no known allergies.   ? ?Review of Systems   ?Review of Systems  ?Constitutional:  Negative for chills and fever.  ?HENT:  Positive for congestion, ear pain and sore throat.   ?Eyes:  Negative for pain and visual disturbance.  ?Respiratory:  Positive for cough. Negative for shortness of breath.   ?Cardiovascular:  Negative for chest pain and palpitations.  ?Gastrointestinal:  Negative for abdominal pain and vomiting.   ?Genitourinary:  Negative for dysuria and hematuria.  ?Musculoskeletal:  Negative for arthralgias and back pain.  ?Skin:  Negative for color change and rash.  ?Neurological:  Negative for seizures and syncope.  ?All other systems reviewed and are negative. ? ?Physical Exam ?Updated Vital Signs ?BP (!) 151/92 (BP Location: Right Wrist)   Pulse 90   Temp 98.8 ?F (37.1 ?C) (Oral)   Resp 18   Ht 1.6 m (5\' 3" )   Wt 136.1 kg   LMP  (LMP Unknown)   SpO2 100%   BMI 53.14 kg/m?  ?Physical Exam ?Vitals and nursing note reviewed.  ?Constitutional:   ?   General: She is not in acute distress. ?   Appearance: Normal appearance. She is well-developed.  ?HENT:  ?   Head: Normocephalic and atraumatic.  ?   Right Ear: Tympanic membrane, ear canal and external ear normal.  ?   Left Ear: Tympanic membrane, ear canal and external ear normal.  ?   Mouth/Throat:  ?   Mouth: Mucous membranes are moist.  ?   Pharynx: Oropharynx is clear. No oropharyngeal exudate or posterior oropharyngeal erythema.  ?   Comments: Uvula midline. ?Eyes:  ?   Extraocular Movements: Extraocular movements intact.  ?   Conjunctiva/sclera: Conjunctivae normal.  ?   Pupils: Pupils are equal, round, and reactive to light.  ?Cardiovascular:  ?  Rate and Rhythm: Normal rate and regular rhythm.  ?   Heart sounds: No murmur heard. ?Pulmonary:  ?   Effort: Pulmonary effort is normal. No respiratory distress.  ?   Breath sounds: Normal breath sounds. No wheezing, rhonchi or rales.  ?Abdominal:  ?   Palpations: Abdomen is soft.  ?   Tenderness: There is no abdominal tenderness.  ?Musculoskeletal:     ?   General: No swelling.  ?   Cervical back: Neck supple.  ?Skin: ?   General: Skin is warm and dry.  ?   Capillary Refill: Capillary refill takes less than 2 seconds.  ?Neurological:  ?   General: No focal deficit present.  ?   Mental Status: She is alert and oriented to person, place, and time.  ?Psychiatric:     ?   Mood and Affect: Mood normal.  ? ? ?ED Results  / Procedures / Treatments   ?Labs ?(all labs ordered are listed, but only abnormal results are displayed) ?Labs Reviewed  ?GROUP A STREP BY PCR  ? ? ?EKG ?None ? ?Radiology ?No results found. ? ?Procedures ?Procedures  ? ? ?Medications Ordered in ED ?Medications - No data to display ? ?ED Course/ Medical Decision Making/ A&P ?  ?                        ?Medical Decision Making ? ?Patient nontoxic no acute distress. ?Throat uvula midline no significant erythema no exudate.  Will check for strep.  TMs clear and very normal bilaterally.  Lungs clear. ? ?Strep test negative.  Will be followed up by culture.  We will treat patient symptomatically. ? ? ?Final Clinical Impression(s) / ED Diagnoses ?Final diagnoses:  ?Viral pharyngitis  ?Viral URI with cough  ? ? ?Rx / DC Orders ?ED Discharge Orders   ? ? None  ? ?  ? ? ?  ?Vanetta Mulders, MD ?12/30/21 0719 ? ?  ?Vanetta Mulders, MD ?12/30/21 [phone] ? ?

## 2021-12-30 NOTE — ED Triage Notes (Signed)
Sore throat X 2 weeks. Pt son was sick last week seen here. Pain worse today.  ?

## 2021-12-30 NOTE — Discharge Instructions (Addendum)
Tylenol Motrin as needed for throat pain.  Over-the-counter cold and flu medicines.  Return for any new or worse symptoms.  Your strep test was negative. ?

## 2021-12-30 NOTE — ED Notes (Signed)
Pt discharged to home. Discharge instructions have been discussed with patient and/or family members. Pt verbally acknowledges understanding d/c instructions, and endorses comprehension to checkout at registration before leaving.  °

## 2024-06-23 ENCOUNTER — Emergency Department (HOSPITAL_COMMUNITY)
Admission: EM | Admit: 2024-06-23 | Discharge: 2024-06-23 | Disposition: A | Discharge status: Home / Self Care | Attending: Emergency Medicine | Admitting: Emergency Medicine

## 2024-06-23 ENCOUNTER — Other Ambulatory Visit: Payer: Self-pay

## 2024-06-23 DIAGNOSIS — J029 Acute pharyngitis, unspecified: Secondary | ICD-10-CM | POA: Diagnosis present

## 2024-06-23 LAB — GROUP A STREP BY PCR: Group A Strep by PCR: NOT DETECTED

## 2024-06-23 MED ORDER — DEXAMETHASONE 1 MG/ML PO CONC
10.0000 mg | Freq: Once | ORAL | Status: DC
Start: 2024-06-23 — End: 2024-06-23
  Filled 2024-06-23: qty 10

## 2024-06-23 MED ORDER — DEXAMETHASONE 4 MG PO TABS
10.0000 mg | ORAL_TABLET | Freq: Once | ORAL | Status: AC
Start: 2024-06-23 — End: 2024-06-23
  Administered 2024-06-23: 10 mg via ORAL
  Filled 2024-06-23: qty 3

## 2024-06-23 NOTE — Discharge Instructions (Addendum)
 Today you were seen for sore throat, I suspect this is likely caused by a virus.  You may do salt water gargles and alternate Tylenol /Motrin  as needed for pain.  Please return to the ED if you have difficulty breathing, worsening pain, or fever that does not go down with Tylenol  or Motrin .  Thank you for letting us  treat you today. After reviewing your labs, I feel you are safe to go home. Please follow up with your PCP in the next several days and provide them with your records from this visit. Return to the Emergency Room if pain becomes severe or symptoms worsen.

## 2024-06-23 NOTE — ED Triage Notes (Signed)
 Pt coming in reporting a sore throat starting yesterday. Pt denies fever or chills.

## 2024-06-23 NOTE — ED Provider Notes (Addendum)
 [place] EMERGENCY DEPARTMENT AT [facility] Provider Note   CSN: [id number] Arrival date & time: 06/23/24  9784     Patient presents with: Sore Throat   Emily Goodman is a 28 y.o. female presents today for sore throat that began yesterday.  Patient denies fever, chills, cough, congestion, nausea, vomiting, shortness of breath, or chest pain.    Sore Throat       Prior to Admission medications   Medication Sig Start Date End Date Taking? Authorizing Provider  acetaminophen  (TYLENOL ) 500 MG tablet Take 500 mg by mouth every 6 (six) hours as needed for mild pain or headache.    [provider]  azithromycin  (ZITHROMAX ) 250 MG tablet Take 1 tablet (250 mg total) by mouth daily. Take first 2 tablets together, then 1 every day until finished. 08/11/21   Joesph Shaver Scales, PA-C  cetirizine (ZYRTEC) 10 MG tablet Take 10 mg by mouth daily as needed for allergies.    [provider]  fluconazole  (DIFLUCAN ) 150 MG tablet Take 1 tablet on day 5 of your antibiotics.  Take second tablet 3 days later. 08/11/21   Joesph Shaver Scales, PA-C  ibuprofen  (ADVIL ) 800 MG tablet Take 1 tablet (800 mg total) by mouth every 8 (eight) hours. 06/16/19   Varnado, Evelyn, MD    Allergies: Patient has no known allergies.    Review of Systems  HENT:  Positive for sore throat.     Updated Vital Signs BP (!) 138/122   Pulse 87   Temp 98.1 F (36.7 C) (Oral)   Resp 16   SpO2 100%   Physical Exam Vitals and nursing note reviewed.  Constitutional:      General: She is not in acute distress.    Appearance: She is well-developed.  HENT:     Head: Normocephalic and atraumatic.     Jaw: No trismus.     Comments: No mouth floor swelling or other indication of Ludwig's angina    Mouth/Throat:     Dentition: No dental abscesses.     Pharynx: Uvula midline. Posterior oropharyngeal erythema present. No pharyngeal swelling, oropharyngeal exudate or uvula swelling.      Tonsils: No tonsillar exudate or tonsillar abscesses.  Eyes:     Conjunctiva/sclera: Conjunctivae normal.  Cardiovascular:     Rate and Rhythm: Normal rate and regular rhythm.  Pulmonary:     Effort: Pulmonary effort is normal. No respiratory distress.  Abdominal:     Palpations: Abdomen is soft.     Tenderness: There is no abdominal tenderness.  Musculoskeletal:        General: No swelling.     Cervical back: Neck supple.  Lymphadenopathy:     Cervical: No cervical adenopathy.  Skin:    General: Skin is warm and dry.     Capillary Refill: Capillary refill takes less than 2 seconds.  Neurological:     Mental Status: She is alert.  Psychiatric:        Mood and Affect: Mood normal.     (all labs ordered are listed, but only abnormal results are displayed) Labs Reviewed  GROUP A STREP BY PCR    EKG: None  Radiology: No results found.   Procedures   Medications Ordered in the ED  dexamethasone  (DECADRON ) 1 MG/ML solution 10 mg (has no administration in time range)  Medical Decision Making Risk Prescription drug management.   This patient presents to the ED for concern of sore throat differential diagnosis includes strep pharyngitis, viral pharyngitis, PTA, Ludwig's angina    Additional history obtained   Additional history obtained from Electronic Medical Record External records from outside source obtained and reviewed including Care Everywhere   Lab Tests:  I Ordered, and personally interpreted labs.  The pertinent results include: Strep PCR negative   Medicines ordered and prescription drug management:  I ordered medication including Decadron     I have reviewed the patients home medicines and have made adjustments as needed   Problem List / ED Course:  Considered for admission or further workup however patient's vital signs, physical exam, and labs are reassuring.  Patient symptoms likely due to viral  pharyngitis.  Patient advised on symptomatic management.  Patient given return precautions.  I feel patient is safe for discharge at this time.     Final diagnoses:  Viral pharyngitis    ED Discharge Orders     None          Francis Ileana SAILOR, PA-C 06/23/24 0532    Francis Ileana SAILOR, PA-C 06/23/24 KENETH Midge Golas, MD 06/23/24 [phone]

## 2024-07-05 ENCOUNTER — Emergency Department (HOSPITAL_BASED_OUTPATIENT_CLINIC_OR_DEPARTMENT_OTHER)
Admission: EM | Admit: 2024-07-05 | Discharge: 2024-07-05 | Discharge status: Against Medical Advice | Attending: Emergency Medicine | Admitting: Emergency Medicine

## 2024-07-05 ENCOUNTER — Encounter (HOSPITAL_BASED_OUTPATIENT_CLINIC_OR_DEPARTMENT_OTHER): Payer: Self-pay | Admitting: Emergency Medicine

## 2024-07-05 ENCOUNTER — Other Ambulatory Visit: Payer: Self-pay

## 2024-07-05 DIAGNOSIS — T7840XA Allergy, unspecified, initial encounter: Secondary | ICD-10-CM | POA: Diagnosis present

## 2024-07-05 DIAGNOSIS — Z5321 Procedure and treatment not carried out due to patient leaving prior to being seen by health care provider: Secondary | ICD-10-CM | POA: Diagnosis not present

## 2024-07-05 NOTE — ED Triage Notes (Signed)
 Patient reports possible reaction to new lip oil. Reports lips have been dry and cracked after using lip oil 2 weeks ago. Today she noticed bumps on her lips. No swellings. Denies difficulty breathing.
